# Patient Record
Sex: Male | Born: 1940 | Race: White | Hispanic: No | Marital: Single
Health system: Southern US, Community
[De-identification: ages and names within clinical notes are randomized; demographics above are authoritative.]

---

## 2009-02-07 ENCOUNTER — Ambulatory Visit: Payer: Self-pay | Admitting: Nephrology

## 2010-11-19 ENCOUNTER — Ambulatory Visit: Payer: Self-pay | Admitting: Ophthalmology

## 2011-05-15 ENCOUNTER — Ambulatory Visit: Payer: Self-pay | Admitting: Internal Medicine

## 2011-05-20 ENCOUNTER — Ambulatory Visit: Payer: Self-pay | Admitting: Internal Medicine

## 2011-05-20 LAB — CBC CANCER CENTER
Basophil #: 0 "x10 3/mm "
Basophil %: 0.1 %
Eosinophil #: 0.2 "x10 3/mm "
Eosinophil %: 1.7 %
HCT: 31.6 % — ABNORMAL LOW
HGB: 10.4 g/dL — ABNORMAL LOW
Lymphocyte %: 6.3 %
Lymphs Abs: 0.7 "x10 3/mm " — ABNORMAL LOW
MCH: 26.4 pg
MCHC: 32.7 g/dL
MCV: 81 fL
Monocyte #: 0.7 "x10 3/mm "
Monocyte %: 6.1 %
Neutrophil #: 9.4 "x10 3/mm " — ABNORMAL HIGH
Neutrophil %: 85.8 %
Platelet: 535 "x10 3/mm " — ABNORMAL HIGH
RBC: 3.93 "x10 6/mm " — ABNORMAL LOW
RDW: 18.9 % — ABNORMAL HIGH
WBC: 10.9 "x10 3/mm " — ABNORMAL HIGH

## 2011-05-20 LAB — PROTIME-INR
INR: 1.1
Prothrombin Time: 14.1 s

## 2011-05-20 LAB — COMPREHENSIVE METABOLIC PANEL
Albumin: 2.9 g/dL — ABNORMAL LOW (ref 3.4–5.0)
Anion Gap: 11 (ref 7–16)
BUN: 26 mg/dL — ABNORMAL HIGH (ref 7–18)
Calcium, Total: 10.1 mg/dL (ref 8.5–10.1)
Chloride: 98 mmol/L (ref 98–107)
Creatinine: 1.9 mg/dL — ABNORMAL HIGH (ref 0.60–1.30)
Osmolality: 282 (ref 275–301)
Potassium: 5.2 mmol/L — ABNORMAL HIGH (ref 3.5–5.1)
SGOT(AST): 28 U/L (ref 15–37)
Sodium: 139 mmol/L (ref 136–145)

## 2011-05-20 LAB — APTT: Activated PTT: 33.2 s

## 2011-05-25 ENCOUNTER — Ambulatory Visit: Payer: Self-pay | Admitting: Internal Medicine

## 2011-05-26 LAB — PROTIME-INR: Prothrombin Time: 13.8 secs (ref 11.5–14.7)

## 2011-05-28 ENCOUNTER — Ambulatory Visit: Payer: Self-pay | Admitting: Internal Medicine

## 2011-05-29 ENCOUNTER — Ambulatory Visit: Payer: Self-pay | Admitting: Internal Medicine

## 2011-06-07 ENCOUNTER — Inpatient Hospital Stay: Payer: Self-pay | Admitting: Internal Medicine

## 2011-06-07 LAB — CBC
HCT: 40.8 % (ref 40.0–52.0)
HGB: 13.1 g/dL (ref 13.0–18.0)
MCH: 26.4 pg (ref 26.0–34.0)
MCV: 82 fL (ref 80–100)
RBC: 4.95 10*6/uL (ref 4.40–5.90)
RDW: 19.4 % — ABNORMAL HIGH (ref 11.5–14.5)
WBC: 21.5 10*3/uL — ABNORMAL HIGH (ref 3.8–10.6)

## 2011-06-07 LAB — URINALYSIS, COMPLETE
Bacteria: NONE SEEN
Bilirubin,UR: NEGATIVE
Blood: NEGATIVE
Glucose,UR: NEGATIVE mg/dL (ref 0–75)
Hyaline Cast: 1
Ketone: NEGATIVE
Leukocyte Esterase: NEGATIVE
Ph: 5 (ref 4.5–8.0)
Specific Gravity: 1.017 (ref 1.003–1.030)
Squamous Epithelial: NONE SEEN
WBC UR: 1 /HPF (ref 0–5)

## 2011-06-07 LAB — COMPREHENSIVE METABOLIC PANEL
Albumin: 3.1 g/dL — ABNORMAL LOW (ref 3.4–5.0)
Alkaline Phosphatase: 172 U/L — ABNORMAL HIGH (ref 50–136)
Calcium, Total: 9.1 mg/dL (ref 8.5–10.1)
Co2: 23 mmol/L (ref 21–32)
EGFR (African American): 37 — ABNORMAL LOW
EGFR (Non-African Amer.): 31 — ABNORMAL LOW
Osmolality: 289 (ref 275–301)
SGOT(AST): 36 U/L (ref 15–37)
SGPT (ALT): 76 U/L

## 2011-06-07 LAB — PROTIME-INR
INR: 0.9
Prothrombin Time: 12.9 secs (ref 11.5–14.7)

## 2011-06-07 LAB — DIFFERENTIAL
Basophil #: 0 10*3/uL (ref 0.0–0.1)
Eosinophil #: 0 10*3/uL (ref 0.0–0.7)
Eosinophil %: 0.1 %
Lymphocyte %: 1.1 %
Monocyte #: 1 10*3/uL — ABNORMAL HIGH (ref 0.0–0.7)
Neutrophil #: 20.2 10*3/uL — ABNORMAL HIGH (ref 1.4–6.5)
Neutrophil %: 94 %

## 2011-06-07 LAB — PRO B NATRIURETIC PEPTIDE: B-Type Natriuretic Peptide: 327 pg/mL — ABNORMAL HIGH (ref 0–125)

## 2011-06-08 LAB — CBC WITH DIFFERENTIAL/PLATELET
Basophil #: 0 10*3/uL (ref 0.0–0.1)
Basophil %: 0 %
HGB: 11.8 g/dL — ABNORMAL LOW (ref 13.0–18.0)
Lymphocyte %: 1 %
MCH: 26.6 pg (ref 26.0–34.0)
MCHC: 32.1 g/dL (ref 32.0–36.0)
Monocyte %: 5.6 %
Neutrophil #: 13.3 10*3/uL — ABNORMAL HIGH (ref 1.4–6.5)
Neutrophil %: 93.3 %
Platelet: 264 10*3/uL (ref 150–440)
RBC: 4.45 10*6/uL (ref 4.40–5.90)

## 2011-06-08 LAB — COMPREHENSIVE METABOLIC PANEL
Albumin: 2.4 g/dL — ABNORMAL LOW (ref 3.4–5.0)
Alkaline Phosphatase: 148 U/L — ABNORMAL HIGH (ref 50–136)
Anion Gap: 12 (ref 7–16)
BUN: 51 mg/dL — ABNORMAL HIGH (ref 7–18)
Calcium, Total: 8.3 mg/dL — ABNORMAL LOW (ref 8.5–10.1)
Glucose: 62 mg/dL — ABNORMAL LOW (ref 65–99)
Potassium: 4 mmol/L (ref 3.5–5.1)
SGOT(AST): 71 U/L — ABNORMAL HIGH (ref 15–37)
SGPT (ALT): 62 U/L
Total Protein: 5.7 g/dL — ABNORMAL LOW (ref 6.4–8.2)

## 2011-06-08 LAB — VANCOMYCIN, TROUGH: Vancomycin, Trough: 18 ug/mL (ref 10–20)

## 2011-06-09 LAB — URINE CULTURE

## 2011-06-10 LAB — BASIC METABOLIC PANEL
Anion Gap: 10 (ref 7–16)
BUN: 21 mg/dL — ABNORMAL HIGH (ref 7–18)
Chloride: 107 mmol/L (ref 98–107)
Co2: 24 mmol/L (ref 21–32)
EGFR (African American): >60
Osmolality: 286 (ref 275–301)

## 2011-06-12 LAB — PATHOLOGY REPORT

## 2011-06-13 LAB — CULTURE, BLOOD (SINGLE)

## 2011-06-15 LAB — BASIC METABOLIC PANEL WITH GFR
Anion Gap: 11
BUN: 30 mg/dL — ABNORMAL HIGH
Calcium, Total: 9 mg/dL
Chloride: 103 mmol/L
Co2: 24 mmol/L
Creatinine: 1.64 mg/dL — ABNORMAL HIGH
EGFR (African American): 54 — ABNORMAL LOW
EGFR (Non-African Amer.): 45 — ABNORMAL LOW
Glucose: 98 mg/dL
Osmolality: 282
Potassium: 4.3 mmol/L
Sodium: 138 mmol/L

## 2011-06-15 LAB — CBC CANCER CENTER
Basophil #: 0 "x10 3/mm "
Basophil %: 0 %
Eosinophil #: 0 "x10 3/mm "
Eosinophil %: 0.2 %
HCT: 34.5 % — ABNORMAL LOW
HGB: 11 g/dL — ABNORMAL LOW
Lymphocyte %: 1.2 %
Lymphs Abs: 0.2 "x10 3/mm " — ABNORMAL LOW
MCH: 26.7 pg
MCHC: 31.9 g/dL — ABNORMAL LOW
MCV: 84 fL
Monocyte #: 0.3 "x10 3/mm "
Monocyte %: 2.3 %
Neutrophil #: 13.9 "x10 3/mm " — ABNORMAL HIGH
Neutrophil %: 96.3 %
Platelet: 249 "x10 3/mm "
RBC: 4.13 "x10 6/mm " — ABNORMAL LOW
RDW: 21.1 % — ABNORMAL HIGH
WBC: 14.4 "x10 3/mm " — ABNORMAL HIGH

## 2011-06-22 LAB — CBC CANCER CENTER
Basophil #: 0 x10 3/mm (ref 0.0–0.1)
Eosinophil #: 0 x10 3/mm (ref 0.0–0.7)
Lymphocyte #: 0.1 x10 3/mm — ABNORMAL LOW (ref 1.0–3.6)
Lymphocyte %: 9.9 %
MCHC: 33.3 g/dL (ref 32.0–36.0)
Monocyte #: 0 x10 3/mm (ref 0.0–0.7)
Neutrophil #: 0.8 x10 3/mm — ABNORMAL LOW (ref 1.4–6.5)
Neutrophil %: 86.1 %
RBC: 3.64 10*6/uL — ABNORMAL LOW (ref 4.40–5.90)
RDW: 21.5 % — ABNORMAL HIGH (ref 11.5–14.5)

## 2011-06-26 ENCOUNTER — Ambulatory Visit: Payer: Self-pay | Admitting: Internal Medicine

## 2011-06-29 LAB — CBC CANCER CENTER
Bands: 10 %
Basophil #: 0 x10 3/mm (ref 0.0–0.1)
Eosinophil %: 1.4 %
Eosinophil: 1 %
Lymphocyte %: 3.1 %
Lymphocytes: 3 %
MCH: 27 pg (ref 26.0–34.0)
MCV: 81 fL (ref 80–100)
Metamyelocyte: 2 %
Monocyte #: 0 x10 3/mm (ref 0.0–0.7)
Monocyte %: 0.9 %
Monocytes: 5 %
Neutrophil %: 94.6 %
Platelet: 219 x10 3/mm (ref 150–440)
RBC: 3.55 10*6/uL — ABNORMAL LOW (ref 4.40–5.90)
RDW: 21.6 % — ABNORMAL HIGH (ref 11.5–14.5)
Segmented Neutrophils: 78 %
WBC: 5.3 x10 3/mm (ref 3.8–10.6)

## 2011-07-06 LAB — HEPATIC FUNCTION PANEL A (ARMC)
Alkaline Phosphatase: 257 U/L — ABNORMAL HIGH (ref 50–136)
Bilirubin, Direct: 0.1 mg/dL (ref 0.00–0.20)
SGPT (ALT): 82 U/L — ABNORMAL HIGH
Total Protein: 6.2 g/dL — ABNORMAL LOW (ref 6.4–8.2)

## 2011-07-06 LAB — CBC CANCER CENTER
Basophil #: 0 x10 3/mm (ref 0.0–0.1)
Eosinophil #: 0 x10 3/mm (ref 0.0–0.7)
HCT: 29.4 % — ABNORMAL LOW (ref 40.0–52.0)
HGB: 9.8 g/dL — ABNORMAL LOW (ref 13.0–18.0)
Lymphocyte #: 0.2 x10 3/mm — ABNORMAL LOW (ref 1.0–3.6)
Lymphocyte %: 1.3 %
MCH: 27.1 pg (ref 26.0–34.0)
MCHC: 33.2 g/dL (ref 32.0–36.0)
MCV: 82 fL (ref 80–100)
Neutrophil #: 17.3 x10 3/mm — ABNORMAL HIGH (ref 1.4–6.5)
RDW: 23.9 % — ABNORMAL HIGH (ref 11.5–14.5)

## 2011-07-06 LAB — BASIC METABOLIC PANEL
BUN: 24 mg/dL — ABNORMAL HIGH (ref 7–18)
Calcium, Total: 8.3 mg/dL — ABNORMAL LOW (ref 8.5–10.1)
Chloride: 101 mmol/L (ref 98–107)
Co2: 25 mmol/L (ref 21–32)
EGFR (African American): 56 — ABNORMAL LOW
EGFR (Non-African Amer.): 46 — ABNORMAL LOW
Sodium: 136 mmol/L (ref 136–145)

## 2011-07-09 ENCOUNTER — Inpatient Hospital Stay: Payer: Self-pay | Admitting: Internal Medicine

## 2011-07-09 LAB — CK-MB
CK-MB: 2.3 ng/mL (ref 0.5–3.6)
CK-MB: 2 ng/mL (ref 0.5–3.6)

## 2011-07-09 LAB — COMPREHENSIVE METABOLIC PANEL
Alkaline Phosphatase: 261 U/L — ABNORMAL HIGH (ref 50–136)
Anion Gap: 9 (ref 7–16)
Bilirubin,Total: 0.8 mg/dL (ref 0.2–1.0)
Chloride: 99 mmol/L (ref 98–107)
Co2: 27 mmol/L (ref 21–32)
EGFR (African American): >60
EGFR (Non-African Amer.): >60
Glucose: 117 mg/dL — ABNORMAL HIGH (ref 65–99)
Osmolality: 275 (ref 275–301)
Potassium: 4.4 mmol/L (ref 3.5–5.1)
SGPT (ALT): 61 U/L
Sodium: 135 mmol/L — ABNORMAL LOW (ref 136–145)
Total Protein: 6.6 g/dL (ref 6.4–8.2)

## 2011-07-09 LAB — PROTIME-INR
INR: 0.9
Prothrombin Time: 12.5 secs (ref 11.5–14.7)

## 2011-07-09 LAB — CBC
HCT: 32.4 % — ABNORMAL LOW (ref 40.0–52.0)
MCHC: 32.4 g/dL (ref 32.0–36.0)
MCV: 83 fL (ref 80–100)
Platelet: 442 10*3/uL — ABNORMAL HIGH (ref 150–440)
RBC: 3.91 10*6/uL — ABNORMAL LOW (ref 4.40–5.90)
WBC: 17.4 10*3/uL — ABNORMAL HIGH (ref 3.8–10.6)

## 2011-07-09 LAB — APTT: Activated PTT: 27.2 secs (ref 23.6–35.9)

## 2011-07-09 LAB — TROPONIN I: Troponin-I: <0.02 ng/mL

## 2011-07-09 LAB — DIGOXIN LEVEL: Digoxin: 1.37 ng/mL

## 2011-07-09 LAB — MAGNESIUM: Magnesium: 1.8 mg/dL

## 2011-07-10 LAB — CK-MB: CK-MB: 1.7 ng/mL (ref 0.5–3.6)

## 2011-07-10 LAB — COMPREHENSIVE METABOLIC PANEL
Albumin: 1.9 g/dL — ABNORMAL LOW (ref 3.4–5.0)
Alkaline Phosphatase: 253 U/L — ABNORMAL HIGH (ref 50–136)
Bilirubin,Total: 0.3 mg/dL (ref 0.2–1.0)
Calcium, Total: 7.7 mg/dL — ABNORMAL LOW (ref 8.5–10.1)
Chloride: 101 mmol/L (ref 98–107)
Creatinine: 1.07 mg/dL (ref 0.60–1.30)
Glucose: 165 mg/dL — ABNORMAL HIGH (ref 65–99)
Osmolality: 281 (ref 275–301)
Potassium: 4.5 mmol/L (ref 3.5–5.1)
SGOT(AST): 94 U/L — ABNORMAL HIGH (ref 15–37)
SGPT (ALT): 63 U/L

## 2011-07-10 LAB — CBC WITH DIFFERENTIAL/PLATELET
Basophil #: 0 10*3/uL (ref 0.0–0.1)
Basophil %: 0 %
Eosinophil %: 0 %
HCT: 25.3 % — ABNORMAL LOW (ref 40.0–52.0)
HGB: 8.4 g/dL — ABNORMAL LOW (ref 13.0–18.0)
Lymphocyte #: 0.1 10*3/uL — ABNORMAL LOW (ref 1.0–3.6)
MCH: 27.2 pg (ref 26.0–34.0)
MCV: 82 fL (ref 80–100)
Monocyte #: 0 10*3/uL (ref 0.0–0.7)
Monocyte %: 0.2 %
Neutrophil #: 11.5 10*3/uL — ABNORMAL HIGH (ref 1.4–6.5)
Platelet: 327 10*3/uL (ref 150–440)
RBC: 3.07 10*6/uL — ABNORMAL LOW (ref 4.40–5.90)
WBC: 11.6 10*3/uL — ABNORMAL HIGH (ref 3.8–10.6)

## 2011-07-12 LAB — BASIC METABOLIC PANEL
BUN: 23 mg/dL — ABNORMAL HIGH (ref 7–18)
Calcium, Total: 7.6 mg/dL — ABNORMAL LOW (ref 8.5–10.1)
Co2: 25 mmol/L (ref 21–32)
Creatinine: 1.21 mg/dL (ref 0.60–1.30)
EGFR (African American): >60
EGFR (Non-African Amer.): >60
Glucose: 71 mg/dL (ref 65–99)
Potassium: 4.2 mmol/L (ref 3.5–5.1)
Sodium: 136 mmol/L (ref 136–145)

## 2011-07-12 LAB — CBC WITH DIFFERENTIAL/PLATELET
Basophil #: 0 10*3/uL (ref 0.0–0.1)
Basophil %: 0 %
Eosinophil %: 0.5 %
HCT: 23.2 % — ABNORMAL LOW (ref 40.0–52.0)
HGB: 7.6 g/dL — ABNORMAL LOW (ref 13.0–18.0)
Lymphocyte %: 3.1 %
MCH: 27 pg (ref 26.0–34.0)
MCHC: 32.7 g/dL (ref 32.0–36.0)
Monocyte #: 0 10*3/uL (ref 0.0–0.7)
Monocytes: 1 %
Neutrophil #: 3.3 10*3/uL (ref 1.4–6.5)
RBC: 2.81 10*6/uL — ABNORMAL LOW (ref 4.40–5.90)
RDW: 23.1 % — ABNORMAL HIGH (ref 11.5–14.5)
WBC: 3.4 10*3/uL — ABNORMAL LOW (ref 3.8–10.6)

## 2011-07-12 LAB — IRON AND TIBC
Iron Bind.Cap.(Total): 225 ug/dL — ABNORMAL LOW (ref 250–450)
Iron: 140 ug/dL (ref 65–175)

## 2011-07-12 LAB — LACTATE DEHYDROGENASE: LDH: 555 U/L — ABNORMAL HIGH (ref 87–241)

## 2011-07-12 LAB — RETICULOCYTES: Reticulocyte: 1.2 % (ref 0.5–1.5)

## 2011-07-13 LAB — CBC WITH DIFFERENTIAL/PLATELET
Basophil #: 0 10*3/uL (ref 0.0–0.1)
HCT: 23.1 % — ABNORMAL LOW (ref 40.0–52.0)
Lymphocyte #: 0 10*3/uL — ABNORMAL LOW (ref 1.0–3.6)
Lymphocyte %: 2.7 %
MCH: 27.1 pg (ref 26.0–34.0)
MCHC: 32.7 g/dL (ref 32.0–36.0)
MCV: 83 fL (ref 80–100)
Monocyte #: 0 10*3/uL (ref 0.0–0.7)
Monocyte %: 0.5 %
Neutrophil #: 1.2 10*3/uL — ABNORMAL LOW (ref 1.4–6.5)
RDW: 22.9 % — ABNORMAL HIGH (ref 11.5–14.5)
WBC: 1.2 10*3/uL — CL (ref 3.8–10.6)

## 2011-07-14 LAB — CBC WITH DIFFERENTIAL/PLATELET
Basophil #: 0 10*3/uL (ref 0.0–0.1)
Eosinophil #: 0 10*3/uL (ref 0.0–0.7)
HCT: 29.5 % — ABNORMAL LOW (ref 40.0–52.0)
Lymphocyte %: 1.9 %
MCH: 27.1 pg (ref 26.0–34.0)
MCHC: 33 g/dL (ref 32.0–36.0)
Monocyte #: 0 10*3/uL (ref 0.0–0.7)
Neutrophil #: 1.2 10*3/uL — ABNORMAL LOW (ref 1.4–6.5)
Neutrophil %: 95.1 %
RBC: 3.59 10*6/uL — ABNORMAL LOW (ref 4.40–5.90)
RDW: 19.5 % — ABNORMAL HIGH (ref 11.5–14.5)

## 2011-07-14 LAB — OCCULT BLOOD X 1 CARD TO LAB, STOOL: Occult Blood, Feces: NEGATIVE

## 2011-07-15 LAB — CBC WITH DIFFERENTIAL/PLATELET
Basophil #: 0 10*3/uL (ref 0.0–0.1)
Basophil %: 0 %
Eosinophil #: 0 10*3/uL (ref 0.0–0.7)
Lymphocyte #: 0 10*3/uL — ABNORMAL LOW (ref 1.0–3.6)
Lymphocyte %: 4.8 %
MCH: 27.1 pg (ref 26.0–34.0)
MCHC: 32.9 g/dL (ref 32.0–36.0)
Monocyte #: 0 10*3/uL (ref 0.0–0.7)
Neutrophil #: 0.8 10*3/uL — ABNORMAL LOW (ref 1.4–6.5)
Neutrophil %: 89.8 %
Platelet: 111 10*3/uL — ABNORMAL LOW (ref 150–440)
RDW: 19.9 % — ABNORMAL HIGH (ref 11.5–14.5)

## 2011-07-18 LAB — EXPECTORATED SPUTUM ASSESSMENT W GRAM STAIN, RFLX TO RESP C

## 2011-07-21 ENCOUNTER — Inpatient Hospital Stay: Payer: Self-pay | Admitting: Internal Medicine

## 2011-07-21 LAB — CBC
HCT: 33.6 % — ABNORMAL LOW (ref 40.0–52.0)
HGB: 11.1 g/dL — ABNORMAL LOW (ref 13.0–18.0)
MCH: 27.7 pg (ref 26.0–34.0)
MCHC: 33.2 g/dL (ref 32.0–36.0)
Platelet: 195 10*3/uL (ref 150–440)
RBC: 4.01 10*6/uL — ABNORMAL LOW (ref 4.40–5.90)
WBC: 8.2 10*3/uL (ref 3.8–10.6)

## 2011-07-21 LAB — COMPREHENSIVE METABOLIC PANEL
Albumin: 1.9 g/dL — ABNORMAL LOW (ref 3.4–5.0)
BUN: 16 mg/dL (ref 7–18)
EGFR (Non-African Amer.): 60 — ABNORMAL LOW
Osmolality: 279 (ref 275–301)
Potassium: 4 mmol/L (ref 3.5–5.1)
SGOT(AST): 57 U/L — ABNORMAL HIGH (ref 15–37)
SGPT (ALT): 50 U/L
Total Protein: 5.8 g/dL — ABNORMAL LOW (ref 6.4–8.2)

## 2011-07-21 LAB — PROTIME-INR
INR: 1
Prothrombin Time: 13.4 secs (ref 11.5–14.7)

## 2011-07-21 LAB — CK TOTAL AND CKMB (NOT AT ARMC)
CK, Total: 58 U/L (ref 35–232)
CK-MB: 2.4 ng/mL (ref 0.5–3.6)

## 2011-07-21 LAB — APTT: Activated PTT: 29.2 secs (ref 23.6–35.9)

## 2011-07-22 LAB — CBC WITH DIFFERENTIAL/PLATELET
Basophil #: 0 10*3/uL (ref 0.0–0.1)
Eosinophil #: 0 10*3/uL (ref 0.0–0.7)
Eosinophil %: 0.1 %
HGB: 9.7 g/dL — ABNORMAL LOW (ref 13.0–18.0)
Lymphocyte #: 0.2 10*3/uL — ABNORMAL LOW (ref 1.0–3.6)
MCH: 27.4 pg (ref 26.0–34.0)
Monocyte #: 0.2 10*3/uL (ref 0.0–0.7)
Platelet: 226 10*3/uL (ref 150–440)
WBC: 8.9 10*3/uL (ref 3.8–10.6)

## 2011-07-22 LAB — BASIC METABOLIC PANEL
Anion Gap: 11 (ref 7–16)
BUN: 16 mg/dL (ref 7–18)
Calcium, Total: 7.7 mg/dL — ABNORMAL LOW (ref 8.5–10.1)
Chloride: 101 mmol/L (ref 98–107)
Creatinine: 1.44 mg/dL — ABNORMAL HIGH (ref 0.60–1.30)
EGFR (African American): >60
EGFR (Non-African Amer.): 52 — ABNORMAL LOW
Glucose: 246 mg/dL — ABNORMAL HIGH (ref 65–99)
Osmolality: 279 (ref 275–301)
Potassium: 4.2 mmol/L (ref 3.5–5.1)
Sodium: 135 mmol/L — ABNORMAL LOW (ref 136–145)

## 2011-07-22 LAB — URINALYSIS, COMPLETE
Bacteria: NONE SEEN
Glucose,UR: 150 mg/dL (ref 0–75)
Ketone: NEGATIVE
Protein: 30
Squamous Epithelial: NONE SEEN

## 2011-07-23 LAB — CBC WITH DIFFERENTIAL/PLATELET
Basophil #: 0 10*3/uL (ref 0.0–0.1)
Basophil %: 0 %
Eosinophil %: 0.1 %
HGB: 9.1 g/dL — ABNORMAL LOW (ref 13.0–18.0)
Lymphocyte #: 0.2 10*3/uL — ABNORMAL LOW (ref 1.0–3.6)
Lymphocyte %: 1.6 %
MCHC: 33.3 g/dL (ref 32.0–36.0)
Monocyte #: 0.7 10*3/uL (ref 0.0–0.7)
Monocyte %: 5.2 %
Neutrophil %: 93.1 %
Platelet: 273 10*3/uL (ref 150–440)
RDW: 20.1 % — ABNORMAL HIGH (ref 11.5–14.5)
WBC: 12.9 10*3/uL — ABNORMAL HIGH (ref 3.8–10.6)

## 2011-07-23 LAB — HEMOGLOBIN A1C: Hemoglobin A1C: 7.3 % — ABNORMAL HIGH (ref 4.2–6.3)

## 2011-07-23 LAB — BASIC METABOLIC PANEL
Anion Gap: 12 (ref 7–16)
BUN: 19 mg/dL — ABNORMAL HIGH (ref 7–18)
Calcium, Total: 7.4 mg/dL — ABNORMAL LOW (ref 8.5–10.1)
Creatinine: 1.26 mg/dL (ref 0.60–1.30)
EGFR (African American): >60
EGFR (Non-African Amer.): >60
Glucose: 183 mg/dL — ABNORMAL HIGH (ref 65–99)
Osmolality: 281 (ref 275–301)
Potassium: 4.3 mmol/L (ref 3.5–5.1)

## 2011-07-23 LAB — HEPATIC FUNCTION PANEL A (ARMC)
Bilirubin, Direct: <0.1 mg/dL (ref 0.00–0.20)
SGPT (ALT): 35 U/L

## 2011-07-26 LAB — CULTURE, BLOOD (SINGLE)

## 2011-07-27 ENCOUNTER — Ambulatory Visit: Payer: Self-pay | Admitting: Internal Medicine

## 2011-09-26 DEATH — deceased

## 2013-09-12 IMAGING — CT CT CHEST W/O CM
1 series · 15 of 33 positions shown, 19 images · non-contrast
Comparison: none

REASON FOR EXAM: dyspnea, hypotension, rll opacity
COMMENTS:

[Series 2: soft tissue · axial · 0.72mm/px · z∈[-438,-132]mm · 15 of 73 slices shown, 19 images]
[im 6/73  mediastinal]
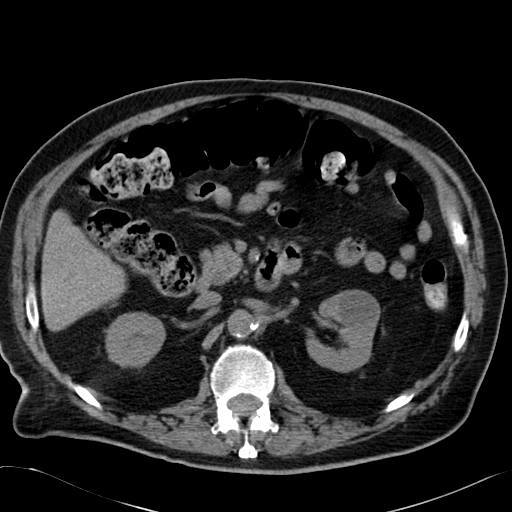
[im 6/73  lung]
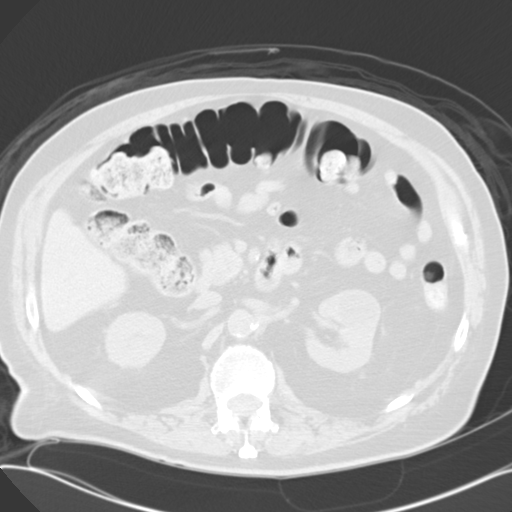
[im 11/73  lung]
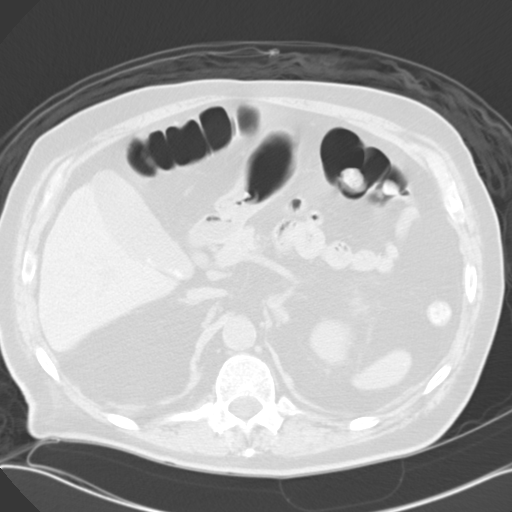
[im 15/73  lung]
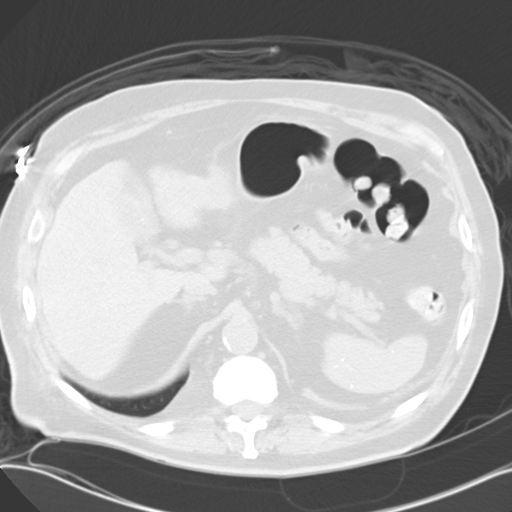
[im 19/73  lung]
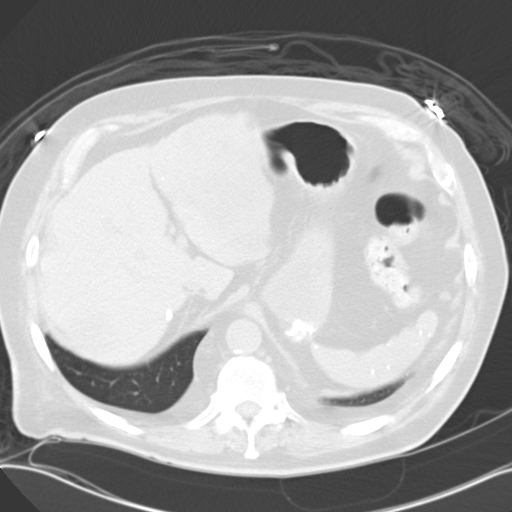
[im 25/73  mediastinal]
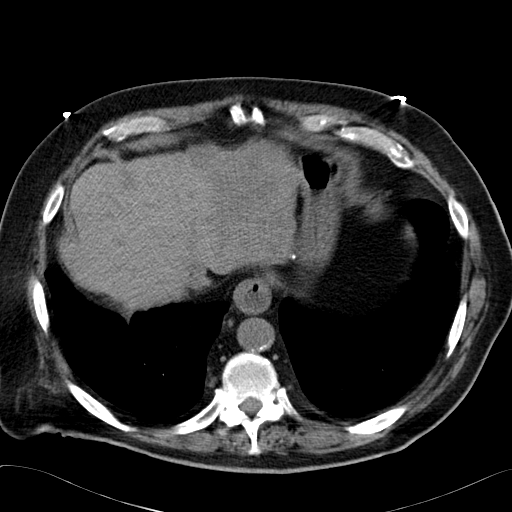
[im 25/73  lung]
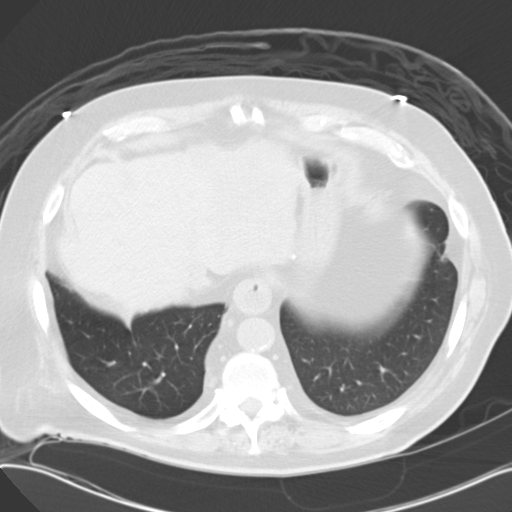
[im 29/73  lung]
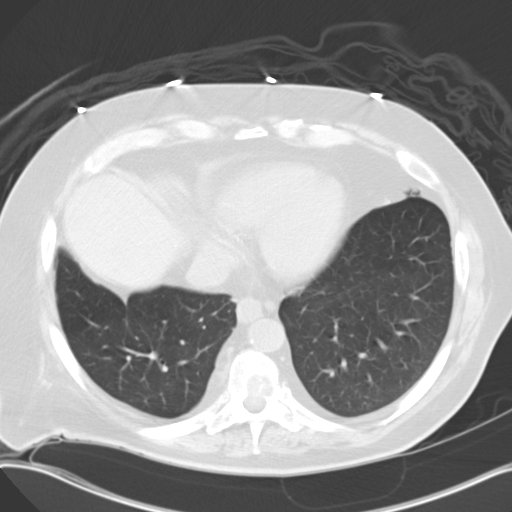
[im 33/73  lung]
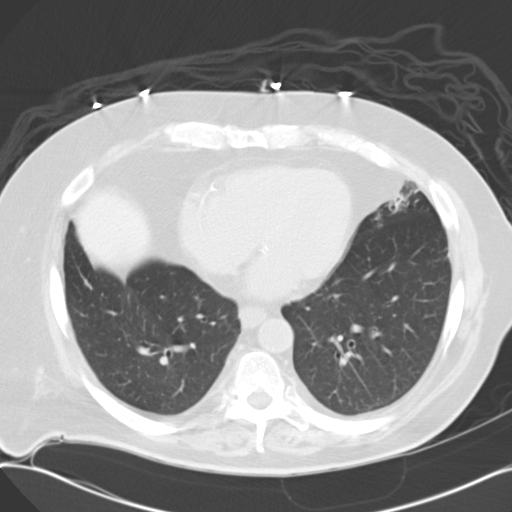
[im 38/73  lung]
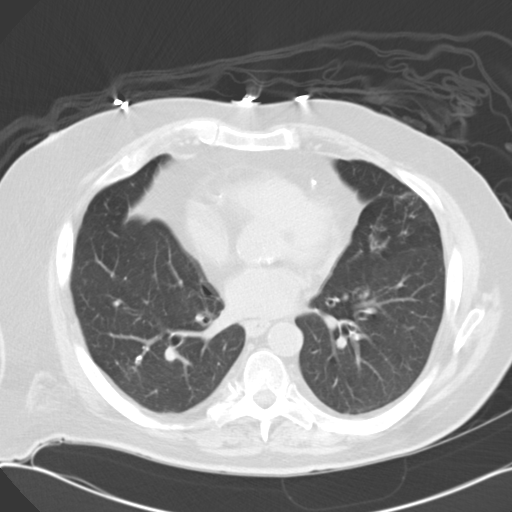
[im 41/73  mediastinal]
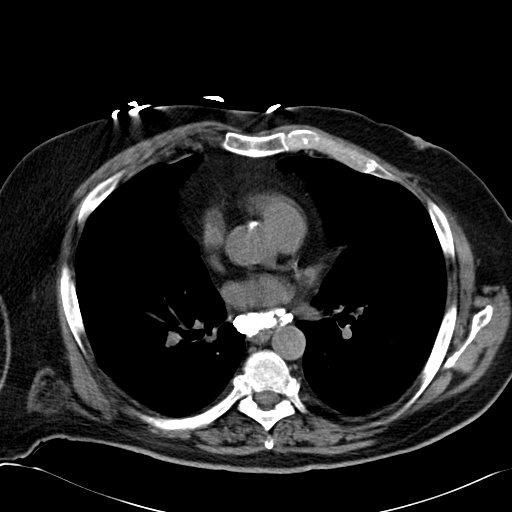
[im 41/73  lung]
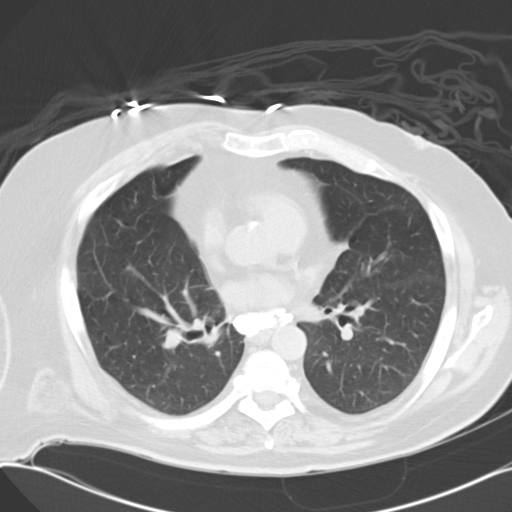
[im 44/73  lung]
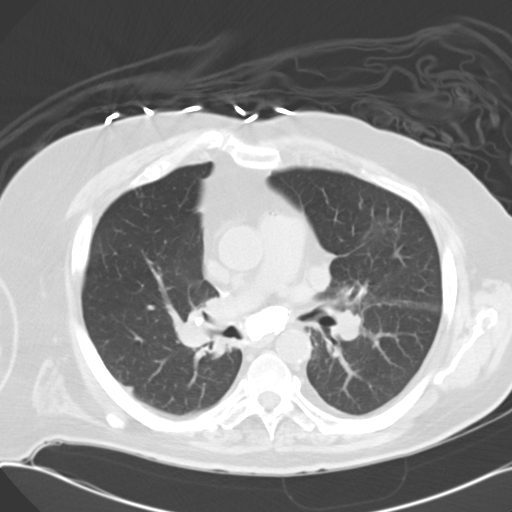
[im 49/73  lung]
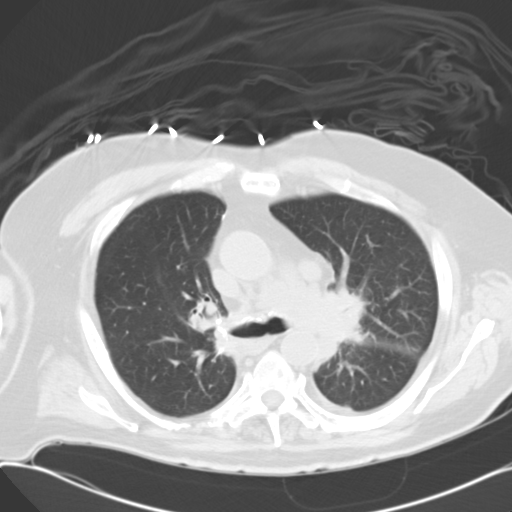
[im 54/73  lung]
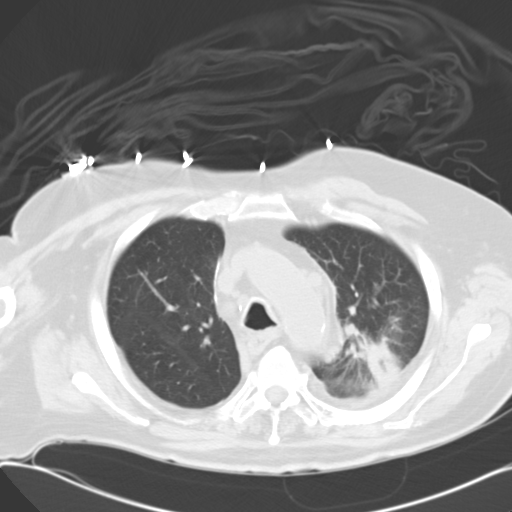
[im 58/73  mediastinal]
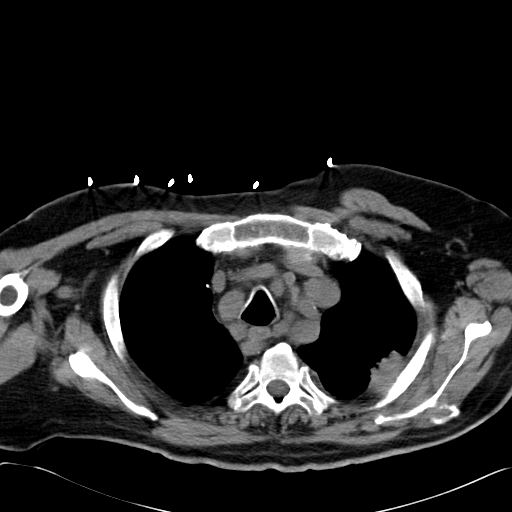
[im 58/73  lung]
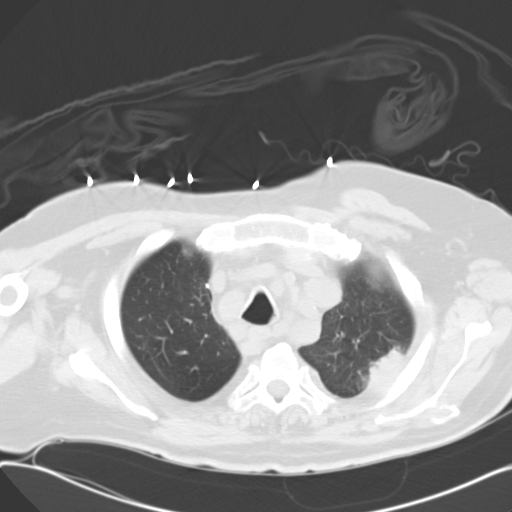
[im 62/73  lung]
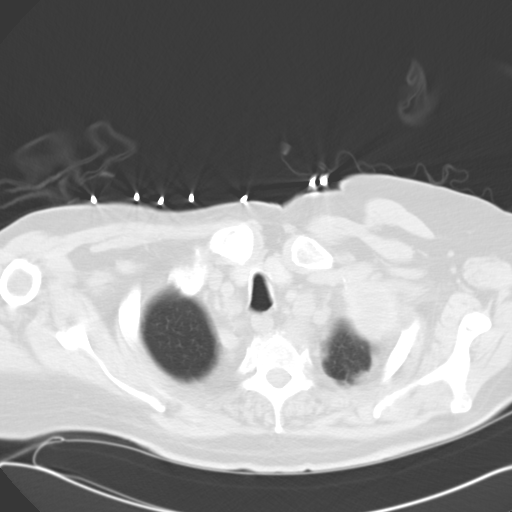
[im 67/73  lung]
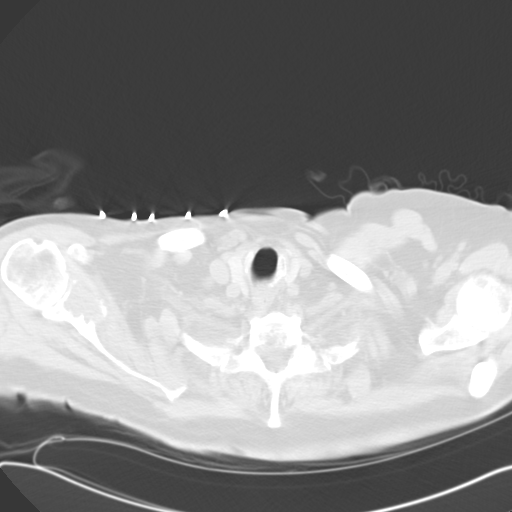

[15 of 33 positions shown; findings below may reference images not displayed]

PROCEDURE:     CT  - CT CHEST WITHOUT CONTRAST  - June 07, 2011  [DATE]

RESULT:     Axial noncontrast CT scanning was performed through the chest
with reconstructions at 5 mm intervals and slice thicknesses. Comparison is
made to the study 15 May, 2011. Review of multiplanar reconstructed
images was performed separately on the VIA monitor.

There is an abnormal pleural-based mass posteriorly in the left upper lobe
with density radiating toward the left hilum. There is a left hilar mass as
well. There may been slight interval decrease in the size of the hilar mass
which measures 5.1 cm AP and 3.8 cm transversely on today's study. The
pleural based mass measures approximately 2.6 cm AP by approximately 3.1 cm
in oblique transverse dimension. There is an enlarged AP window lymph node
which measures 2 cm in long axis. There is a densely calcified nodal mass in
the subcarinal region. There are a few small calcified lymph nodes in the
right hilum. The caliber of the thoracic aorta is normal. The cardiac
chambers are normal in size. There is no pleural effusion.

Within the upper abdomen there are numerous hypodensities within the liver.
The largest lies in the left lobe and measures at least 5.8 cm in diameter.
There are tiny layering gallstones in the dependent portion of the
gallbladder. I see no adrenal masses. There are punctate calcifications in
the normal sized spleen.
IMPRESSION: 1. There has been mild interval decrease in the size of the left hilar mass
and the left upper lobe mass. There remain enlarged hilar lymph nodes.
2. There are emphysematous changes in both lungs. There is a stable lingular
density that suggests fibrosis.
3. There are widespread hepatic metastatic lesions with the largest mass
noted in the left hepatic lobe.
4. There are gallstones present.

## 2014-08-19 NOTE — Discharge Summary (Signed)
PATIENT NAME:  Cameron [name], Cameron [name] MR#:  [id number] DATE OF BIRTH:  Jul 24, 1940  DATE OF ADMISSION:  07/21/2011 DATE OF DISCHARGE:  07/24/2011  PRIMARY CARE PHYSICIAN: Ezequiel Kayser, MD  PRIMARY ONCOLOGIST: Leia Alf, MD  REASON FOR ADMISSION: Shortness of breath.  DISCHARGE DIAGNOSES: 1. Acute hypoxic respiratory failure which has progressed to chronic. The patient has met criteria for continuous home oxygen.  2. Bilateral pneumonia with suspected healthcare-associated pneumonia with recent hospitalization prior to this one for pneumonia.  3. Acute chronic obstructive pulmonary disease exacerbation.  4. Acute renal failure superimposed on chronic kidney disease.  5. 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 The patient is currently on chemotherapy.  6. 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  7. History of chemotherapy induced myelosuppression with history of neutropenia and anemia.  8. Chronic atrial fibrillation. The patient is not on anticoagulation at baseline.  9. History of diabetes mellitus, which is insulin requiring.  10. Oral thrush.  11. History of hypertension.  12. Recently diagnosed right lower pneumonia, 07/15/2011, requiring hospital admission.   CODE STATUS: DO NOT RESUSCITATE.   CONSULTANTS:  1. Leia Alf, MD - Oncology. 2. Hospice screen.  DISCHARGE DISPOSITION: Home with hospice.   DISCHARGE MEDICATIONS:  1. Oxygen at 2 liters per minute via nasal cannula continuously.  2. Doxycycline 100 mg p.o. every 12 hours x5 days. 3. Augmentin 875 mg p.o. twice a day x5 days. 4. Prednisone taper as written on prescription. 5. Albuterol metered dose inhaler 1 to 2 puffs inhaled every 4 to 6 hours p.r.n.  6. Klonopin 0.5 mg take 1/2  to 1 tab p.o. twice a day p.r.n. anxiety.  7. Morphine 20 mg/mL - take 0.5 mL (10 milligrams) orally every 1 to 2 hours p.r.n. pain.  8. Folic acid 1 mg p.o. daily.  9. DuoNebs one dose inhaled every 4 hours p.r.n.  10. Fentanyl 75 mcg/h patch transdermally applied every 72 hours. 11. Promethazine 25 mg p.o. every six hours p.r.n. nausea and vomiting. 12. Docusate sodium 100 mg p.o. every other day.  13. Humalog mix 50/50 12 units subcutaneously twice a day. 14. Robitussin AC 5 to 10 mL p.o. every 6 hours p.r.n. cough. 15. Advair Diskus 250/50 one puff inhaled twice a day. 16. Digoxin 125 mcg p.o. daily.   DISCHARGE CONDITION: Improved, stable.   DISCHARGE ACTIVITY: As tolerated.  DISCHARGE DIET: ADA.   DISCHARGE INSTRUCTIONS:  1. Take medications as prescribed. 2. Return to the emergency department for recurrence of symptoms or for worsening shortness of breath, cough, fever, chills, weakness, or for any bleeding complications.  FOLLOWUP INSTRUCTIONS: 1. Followup with Dr. Leia Alf within 1 to 2 weeks. The patient needs repeat CBC within one week.  2. Followup with Dr. Ezequiel Kayser in 1 to 2 weeks. The patient needs repeat BMP within one week.  PROCEDURES: Chest x-ray, PA and lateral, on 07/21/2011: Patchy increase in densities bilaterally consistent with pneumonia.   PERTINENT LABORATORY DATA: CBC on admission WBC 8.3, hemoglobin 11.1, hematocrit 33.6 and platelets 195.   Blood cultures x2, from 07/21/2011: One bottle with no growth to date. The other bottle with Coag-negative staph, possible contamination from skin flora. Suspected to be a contaminant.   EKG on admission: Sinus tachycardia with heart rate 112 beats per minute with  normal axis and with no acute ST or T wave changes.   Cardiac enzymes negative on admission.   Liver function tests on admission: Total protein 5.8,  albumin 1.19, total bilirubin 0.5, AST 57, ALT 50, and alkaline phosphatase 230.   Hemoglobin  A1c 7.3.   Renal function normal on admission with BUN 16 and creatinine 1.27. Creatinine 1.44 on 07/22/2011 and creatinine improved to 1.26 on 07/23/2011.   BRIEF HISTORY AND HOSPITAL COURSE:  1. The patient is a very pleasant 74 year old male with past medical history of recurrent metastatic adenocarcinoma carcinoma of the lung with metastases to the liver and bone as well as spinal metastases status post right upper lobe resection and currently on chemotherapy and followed by Dr. Leia Alf with history of abnormal LFTs due to liver mets and vocal cord polyps secondary to recurrent laryngeal nerve compression with chronic hoarseness as a result, CKD, hypertension, chronic obstructive pulmonary disease, chronic atrial fibrillation, history of chemotherapy-induced myelosuppression with neutropenia and anemia, and recent hospitalization for pneumonia who presented to the emergency department with complaints of shortness of breath and productive cough and was found to be hypoxic on room air with oxygen saturations 88%. Chest x-ray was suggestive of bilateral pneumonia and thereafter he was admitted to the medical floor for further evaluation and management. Blood cultures were obtained and he was empirically started on IV antibiotics and was maintained on supplemental oxygen via nasal cannula which was titrated to keep his oxygen saturations greater than 90%. He was also noted to have acute chronic obstructive pulmonary disease exacerbation secondary to his pneumonia for which he was started on IV steroids and placed on inhalers and bronchodilator support, and he was also maintained on Advair. With all these measures, the patient's symptoms have significantly improved. He did still remain hypoxic, however, mostly with ambulation, but also somewhat with rest. He could not be successfully weaned off oxygen and met criteria for continuous home oxygen, which has been arranged for this patient. Once his condition  was noted to have improved, he was transitioned off IV antibiotics to oral antibiotics and transitioned off IV steroids to oral prednisone taper, which he will continue and complete as an outpatient. Home oxygen has been arranged for the patient. One out of two sets of blood cultures showed Coag-negative staph and this was felt to be a contaminant and likely skin flora. The other blood culture did not reveal any growth to date. It was felt that he probably has healthcare associated pneumonia as he was recently hospitalized for pneumonia recently in March 2013. Therefore, he was placed on broad-spectrum IV antibiotics which covers for most all organisms including MRSA as well as Pseudomonas, and he was initially on vancomycin and Zosyn. His IV antibiotics have been converted to oral route, and he will be on doxycycline and Augmentin as an outpatient. The patient did not provide a sputum specimen to be sent to the lab for analysis and his cough is now improved and he is having a dry cough. The patient was seen in consultation by Dr. Ma Hillock of oncology who was in agreement with antibiotic therapy for the patient's pneumonia. His chemotherapy is currently on hold given his acute infection and the patient will followup with Dr. Ma Hillock upon hospital discharge to discuss when and if palliative chemotherapy can be continued. The patient is currently receiving Alimta as an outpatient.  2. Acute renal failure superimposed on CKD - Acute component was felt to be prerenal in etiology from volume depletion.  The patient did admit to drinking less fluids prior to admission and after hydration with IV fluids he is now euvolemic and his acute renal failure has resolved. He is encouraged to maintain adequate oral hydration. He was nonoliguric.  3. History of chemotherapy-induced myelosuppression and history of neutropenia and anemia - the patient was not noted to be neutropenic during this hospitalization, although he has  asymptomatic anemia and there were no acute indications for blood transfusion. He was however transfused 2 units of PRBCs the week prior to this hospitalization, during his previous admission and then it seems that his hemoglobin and hematocrit have remained stable post transfusion and Dr. Ma Hillock recommends transfusing the patient for a hemoglobin of less than 8. The patient will follow up closely with Dr. Ma Hillock as an outpatient to have his CBC repeated. The patient was instructed to return to the hospital he notices any fevers, chills, or any bleeding complications.  4. Chronic atrial fibrillation - the patient maintained heart rate control on digoxin and EKG reveals sinus tachycardia, but this was likely secondary to his acute illness with hypoxia and pneumonia and with oxygen and antibiotics his heart rate has normalized. He was monitored with off unit telemetry and had no runs of atrial fibrillation and had predominantly sinus rhythm. The patient is not on any anticoagulation or aspirin at baseline and would be at high risk for bleeding were he to receive these agents given his history of anemia and also anemia requiring recent blood transfusion. The patient will likely receive further chemotherapy as an outpatient which will likely cause further drop of his hemoglobin and hematocrit. The patient was agreeable to not taking any aspirin or anticoagulants at this time. In any case, he remains in normal sinus rhythm and not in atrial fibrillation.  5. Type 2 diabetes mellitus - relatively well controlled with Hemoglobin A1c of 7.3. The patient is to continue Humalog mix at 50/50 as an outpatient. He did receive some sliding scale insulin as an inpatient as he had some episodes of hyperglycemia which was likely steroid induced. Suspect his blood sugars will improve as his steroids are being tapered.  6. Oral thrush likely from immunosuppression, chemotherapy induced, as well as from underlying malignancy plus  recent use of steroids and antibiotics for which the patient received nystatin swish and swallow and thereafter his oral thrush had resolved. 7. Abnormal liver function tests due to liver mets - this will need to be closely followed by Dr. Ma Hillock as an outpatient. The patient was without any abdominal pain, nausea, or vomiting.   The patient was seen by our hospice care team prior to discharge and was felt to be appropriate for home hospice which has been arranged for the patient. The patient also stated to myself as well as to the nursing staff that in the event of cardiopulmonary arrest he did not wish to receive any CPR, cardiac defibrillation, or intubation or mechanical ventilation. Therefore, we updated his code status to DO NOT RESUSCITATE, as per the patient's request.   On 07/24/2011, the patient was hemodynamically stable with significant improvement in his shortness of breath and cough and was without any fevers or chills and felt to be stable for discharge home with hospice with close outpatient follow-up to which the patient was agreeable.  TIME SPENT ON DISCHARGE: Greater than 30 minutes. ____________________________ Romie Jumper, MD knl:slb D: 07/28/2011 22:06:07 ET T: 07/29/2011 13:18:30 ET JOB#: [id number]  cc: Romie Jumper, MD, <Dictator> Christena Flake. [place],  MD Sandeep R. Ma Hillock, MD Romie Jumper MD ELECTRONICALLY SIGNED 08/05/2011 22:22

## 2014-08-19 NOTE — Consult Note (Signed)
Reason for Visit: This 74 year old Male patient presents to the clinic for initial evaluation of  Metastatic cancer .   Referred by Dr. Ma Hillock.  Diagnosis:   Chief Complaint/Diagnosis   74 year old male with widespread metastatic disease most likely of lung primary   Pathology Report Pathology pending biopsy tomorrow of iliac crest    Imaging Report PET CT scan reviewed    Referral Report Clinical notes reviewed    Planned Treatment Regimen Palliative radiation therapy to spine    HPI   patient is a 75 year old male status post surgical resection of the right lung for lung cancer at [facility] in 2007 followed by adjuvant chemotherapy. He has recently developed hoarseness of voice. He is also develop some upper and mid back pain. Recent PET/CT scan was performed showing widespread metastatic disease. Also has destruction of vertebral bodies specially upper thoracic and midthoracic region. He is having no cough hemoptysis or chest tightness at this time. I been asked to evaluate the patient for possible palliative radiation therapy to his spine.  Past Hx:    Kidney Functions:    Diabetic:    Hypertension:    Bone Met:    Lung Cancer:    Right Upper Lobectomy December 2007:   Past, Family and Social History:   Past Medical History positive    Cardiovascular atrial fibrillation; hyperlipidemia; hypertension    Respiratory COPD    Genitourinary Chronic renal insufficiency    Endocrine diabetes mellitus    Family History noncontributory    Social History positive    Social History Comments Greater than 50-pack-year smoking history quit smoking one month prior    Additional Past Medical and Surgical History Accompanied by family member today   Allergies:   No Known Allergies:   Home Meds:  Home Medications:  morphine 20 mg/mL concentrate: Take 0.5 mL (10 mg) orally every 1 - 2 hours as needed for pain, Active  Duragesic-12 12 mcg/hr film, extended release: 1 PATCH  transdermal every 72 hours x 30 days, Active  Megace 40 mg/mL suspension: Take 20 mL (800 mg) orally once a day , Active  dexamethasone 4 mg tablet: Take one tablet by mouth 4 times a day x3 days, then  1 tablet 3 times a day x3 days, then 1 tablet twice a day x3 days, then 1 tablet once daily x3 days and stop , Active  Humalog Mix 75/25 subcutaneous suspension: 25 unit(s) subcutaneous 2 times a day, Active  pravastatin 40 mg oral tablet: 1 tab(s) orally once a day (at bedtime), Active  metoprolol succinate 25 mg oral tablet, extended release: 1 tab(s) orally once a day, Active  furosemide 20 mg oral tablet: 1 tab(s) orally once a day, Active  enalapril 20 mg oral tablet: 1 tab(s) orally once a day, Active  ProAir HFA: 2 puff(s) inhaled 2 times a day and as needed , Active  Spiriva 18 mcg inhalation capsule: 1 each inhaled once a day, Active  amlodipine $RemoveBef'20mg'vCxapeflpA$ : 1 tab(s) orally once a day, Active  Review of Systems:   General negative    Performance Status (ECOG) 1    Skin negative    Breast negative    Ophthalmologic negative    ENMT negative    Respiratory and Thorax see HPI    Cardiovascular see HPI    Gastrointestinal negative    Genitourinary see HPI    Musculoskeletal negative    Neurological negative    Psychiatric negative    Hematology/Lymphatics negative  Endocrine see HPI    Allergic/Immunologic negative   Physical Exam:  General/Skin/HEENT:   General normal    Skin normal    Eyes normal    ENMT normal    Head and Neck normal    Additional PE Well-developed wheelchair-bound male in NAD. He is having pain localized to his upper thoracic mid thoracic spine. Motor sensory and DTR levels are equal and symmetric in the upper and lower extremities. No significant adenopathy is appreciated. Lungs are clear to A&P. No pain is elicited on deep palpation of the spine. No sensory or motor levels is a appreciated.   Breasts/Resp/CV/GI/GU:   Respiratory and  Thorax normal    Cardiovascular normal    Gastrointestinal normal    Genitourinary normal   MS/Neuro/Psych/Lymph:   Musculoskeletal normal    Neurological normal    Lymphatics normal   Other Results:  Radiology Results: CT:    18-Jan-13 09:00, CT Chest Without Contrast   CT Chest Without Contrast    REASON FOR EXAM:    abnormal CXR hx of lung CA RU lobectomy WO because of   diabetes decreased kid...  COMMENTS:       PROCEDURE: MCT - MCT CHEST WITHOUT CONTRAST  - May 15 2011  9:00AM     RESULT:     TECHNIQUE: CT of the chest is performed without contrast with images   reconstructed in the axial plane at 5.0 mm slice thickness.     There is no previous examination for comparison.    FINDINGS: Irregular soft tissue density is present in the posterior   lateral left upper lobe, extending toward the paramediastinal region at     the level of the aortic arch with soft tissue density extending into the   AP window and left hilar region. The appearance is most consistent with   underlying malignant disease. The largest anterior to posterior extent   appears to be on image #17 at mediastinal window settings measuring 5.05   cm with a medial to lateral dimension of 6.79 cm on image #16. The   superior to inferior extent of the process extends over at least 8 cm.   Along the superior margin of the subpleural to pleural based mass, there   is evidence of invasion into the chest wall seen in the area of image   #10. There is no pleural or pericardial effusion. Calcified density in   the subcarinal region is consistent with probable granulomatous change.   Multiple punctate calcifications are seen in the spleen and liver. There   appears to be evidence of soft tissue in the AP window region but given   the lack of IV contrast, accurate measurement and assessment of the soft   tissue there is difficult. There is a low attenuation lesion in the   anterior mid to upper left kidney  measuring 3.13 cm diameter with a     Hounsfield reading of 0 consistent with a cyst. No adrenal mass is   evident. The esophagus appears to be unremarkable. Lung window images   reveal pleural based paraspinal soft tissue density extending from image   #25 to image #30. At mediastinal window settings this area measures 2.17   cm anterior to posterior and 1.04 cm medial to lateral on image #27.   Para-aorticadenopathy is present with the largest area seen near the   great vessels measuring 2.05 x 2.31 cm on image #11. An enlarged lymph   node is seen just  superior and lateral to the main pulmonary artery   measuring at short axis 1.51 cm image #20.    IMPRESSION:  Left upper lobe malignancy with direct extension to the   hilar region with mediastinal adenopathy. There is a paraspinal mass in   the left lower lobe which is nonspecific. There are bronchiectatic   changes in the left lung base in the region of the lingula with some   underlying fibrosis. There is evidence of possible chest wall invasion by     the left upper lobe mass deep to the scapula.      Thank you for this opportunity to contribute to the care of your patient.           Verified By: Sundra Aland, M.D., MD  Nuclear Med:    28-Jan-13 09:46, PET/CT Scan Lung Cancer Restaging   PET/CT Scan Lung Cancer Restaging    REASON FOR EXAM:    lung nodule  COMMENTS:       PROCEDURE: PET - PET/CT RESTAGING LUNG CA  - May 25 2011  9:46AM       RESULT:     History: Lung nodule.    Comparison: CT of 05/15/2011. Following determination of a fasting blood   sugar of 113 mg/deciliter 12.53 mCi of F-18 FDG was administered to the   patient for whole-body PET CT. CT was obtained for attenuation correction   and fusion. Intensely PET positive lesions are noted throughout the left   upper chest wall, posterior left upper lobe, mediastinal lymph nodes,     hilar lymph nodes, left scapula, right scapula, thoracolumbar  spine,   ribs, pelvis, and sacrum. These findings are consistent with extensive   metastatic disease possibly from lung cancer. Intense activity is also   notedin the left and right lobe of the liver consistent with metastatic   disease. Maximum SUV levels up to approximately 7 noted.     IMPRESSION: Diffuse malignancy as described above.           Verified By: Osa Craver, M.D., MD   Assessment and Plan:  Impression:   widespread metastatic disease most likely recurrent lung cancer with spinal involvement in 74 year old male  Plan:   at the stomach to go ahead with palliative radiation therapy to his thoracic spine. We will plan on delivering 3450 cGy in 15 fractions to trydecrease some of the radiation esophagitis. Risks and benefits of treatment including dysphasia skin reaction were reviewed with the patient and he seems to comprehend my treatment plan well. I have set him up and simulated him on an urgent basis today and will start his treatments tomorrow. We will also review his pathology when it becomes available.  I would like to take this opportunity to thank you for allowing me to continue to participate in this patient's care.  CC Referral:  cc:: Dr. Ezequiel Kayser   Electronic Signatures: Baruch Gouty, Roda Shutters (MD)  (Signed 30-Jan-13 11:39)  Authored: HPI, Diagnosis, Past Hx, PFSH, Allergies, Home Meds, ROS, Physical Exam, Other Results, Encounter Assessment and Plan, CC Referring Physician   Last Updated: 30-Jan-13 11:39 by Armstead Peaks (MD)

## 2014-08-19 NOTE — Discharge Summary (Signed)
PATIENT NAME:  Cameron [name], Cameron [name] MR#:  [id number] DATE OF BIRTH:  12-28-1940  DATE OF ADMISSION:  06/07/2011 DATE OF DISCHARGE:  06/11/2011  DISCHARGE DIAGNOSES:  1. Hypotension, weakness possibly due to relative adrenal insufficiency. 2. Anemia. 3. Acute renal failure due to dehydration versus acute tubular necrosis from hypotension. 4. Elevated AST and ALP due to liver metastasis.  5. Metastatic lung cancer. 6. 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 7. Elevated [name]-dimer. 8. History of atrial fibrillation. 9. Chronic obstructive pulmonary disease.  10. Diabetes   DISPOSITION: The patient is being discharged home with [facility]/home health.   DIET: Low sodium, 1,800 calorie ADA diet.   ACTIVITY: As tolerated.   FOLLOW-UP:  1. Follow-up with Dr. Sherrlyn HockPandit 02/18 at 1:15 p.m.  2. Follow-up with primary care physician, Dr. Mickey Farberavid Thies, in 1 to 2 weeks after discharge.   DISCHARGE MEDICATIONS:  1. Decadron 4 mg daily.  2. Digoxin 0.125 mg daily.  3. Tussionex 5 milliliters b.i.[name]. p.r.n. cough.  4. Fentanyl patch 25 mcg q.72h.  5. Humalog 75/25, twenty-five units subcutaneously b.i.[name].  6. NovoLog insulin sliding scale.  7. DuoNebs q.6 hours p.r.n. shortness of breath.  8. Folic acid 1 mg daily.  9. Morphine 20 mg/mL 0.5 mL q.1-2h. p.r.n. for pain. 10. ProAir HFA 2 puffs b.i.[name]. p.r.n.   CONSULTATIONS:  1. Palliative care consultation with Dr. Harvie JuniorPhifer.  2. Oncology consultation with Dr. Sherrlyn HockPandit.   LABORATORY, DIAGNOSTIC, AND RADIOLOGICAL DATA: The patient's cosyntropin showed elevated to normal cortisol level. CT of chest without contrast showed interval decrease in the size of the left hilar mass and left upper lobe mass. There is hilar lymphadenopathy. Severe emphysema in both lungs, stable lingular density. Widespread metastatic hepatic lesions. Blood cultures negative. No growth so far. CBC: White count 21.5 on admission, 14.2 by the time of discharge.  Hemoglobin 11.8. Normal platelet count. BUN 58 on admission, 21 by the time of discharge, creatinine 2.27 on admission, 1.36 by the time of discharge. BNP 327, elevated AST and ALP.  Cardiac enzymes negative.   HOSPITAL COURSE: The patient is a 74 year old male with past medical history of metastatic lung cancer with extensive metastasis, chronic renal insufficiency, hypertension, chronic obstructive pulmonary disease, atrial fibrillation, who has been off anticoagulation, hyperlipidemia who presented with weakness, failure to thrive, falls, poor appetite and hypotension.  1. Hypotension. The patient was initially admitted to the Intensive Care Unit and started on Levophed drip. He was resuscitated with fluids, which resulted in improvement in his blood pressure and he was able to be weaned off the Levophed drip. All his antihypertensive medications were held. The etiology was unclear, but there was a possibility that he had relative adrenal insufficiency. The patient had a recent short taper of Decadron to prevent clot compression because of receiving palliative radiation therapy to his spine. A cosyntropin stimulation test showed normal/elevated levels of cortisol. After discussion with Dr. Sherrlyn HockPandit, the patient's primary oncologist, the patient has been restarted on low-dose Decadron. He was also empirically started on antibiotics thinking of possibility of sepsis. However, all of his cultures have been negative and he has been afebrile. Therefore, his antibiotics were discontinued.  2. Anemia. The patient was felt to be hemoconcentrated on admission. After IV fluids his hemoglobin fell to his baseline of around 10.4.  3. Acute renal failure. The patient had a creatinine of 2.27 on admission which had significantly improved by the time of discharge. This was felt to be due to possible  dehydration versus ATN due to hypotension. His last creatinine was 1.9 in 01/13. At the time of discharge, his creatinine is  1.36.  4. Elevated AST and ALP. This was possibly due to liver mets. It was felt better to hold the patient's aspirin for the time being.  5. Left vocal cord paralysis secondary to recurrent laryngeal nerve compression. The patient was seen by a speech therapist during the hospitalization. No signs of aspiration were noted.  6. Elevated [name]-dimer. This was felt to be due to lung cancer.  7. History of atrial fibrillation. The patient has been in normal sinus rhythm. All his antihypertensive medications have been discontinued due to blood pressure. He was started on digoxin with good heart rate control.  8. Chronic obstructive pulmonary disease. The patient remained stable. There was no evidence of chronic obstructive pulmonary disease exacerbation. He should continue his ProAir HFA and p.r.n. nebulizer treatment.  9. Metastatic lung cancer. The patient was seen by Dr. Sherrlyn Hock in the hospital. He has severe disease. After discussion with his primary oncologist, the patient decided to be a DO NOT RESUSCITATE. Initially, plans were made to discharge the patient to rehab. However, he wanted to get one more session of chemotherapy. Therefore, Dr. Sherrlyn Hock recommended discharge home with home health/[facility] rather than going to rehab. As per Dr. Sherrlyn Hock if the patient does poorly, he will arrange transfer to hospice facility as an outpatient.   TIME SPENT: 45 minutes.  ____________________________ Cameron Meigs, MD sp:ap [name]: 06/11/2011 15:42:17 ET T: 06/12/2011 12:16:08 ET JOB#: [id number]  cc: Cameron Meigs, MD, <Dictator> Cameron Dear. Harrington Challenger, MD Cameron Meigs MD ELECTRONICALLY SIGNED 06/12/2011 16:49

## 2014-08-19 NOTE — H&P (Signed)
PATIENT NAME:  Cameron HolmesSTACY, Cameron [name] MR#:  [id number] DATE OF BIRTH:  1940/05/11  DATE OF ADMISSION:  07/09/2011  PRIMARY CARE PHYSICIANS: Sandeep R. Sherrlyn HockPandit, MD and Neomia Dearavid N. Thies, MD    HISTORY OF PRESENT ILLNESS: The patient is a 74 year old Caucasian male with past medical history significant for history of metastatic lung carcinoma, history of diabetes, chronic renal insufficiency, hypertension, chronic obstructive pulmonary disease, also atrial fibrillation, presented to the hospital with complaints of worsening shortness of breath. According to the patient, he was sitting on the couch and watching TV at around 6:00 a.m. on the day of admission when suddenly he started noticing chest palpitation as well as left-sided left lower chest area tightness, 3 out of 10 by intensity. No lightheadedness or dizziness, no presyncopal episode, however, he been having progressive shortness of breath, gradually worsening over the past couple of weeks. He has been coughing yellow phlegm, however, denies any fevers or chills. Because he was not on oxygen at home, he presented to the hospital; and he was noted to have oxygen saturation of 94% on room air, however, his oxygen saturation dropped down to 86% whenever he exerted himself. Hospitalist Services were contacted for admission.   PAST MEDICAL HISTORY: Significant for: 1. 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  2. History of admission in February 2013 for hypertension, weakness, which was felt to be due to relative adrenal insufficiency, anemia, acute renal failure due to dehydration versus acute  tubular necrosis from hypertension, also elevated AST as well as ALT due to liver metastasis, metastatic lung cancer.  3. Left vocal cord paralysis due to recurrent laryngeal nerve compression.  4. History of atrial fibrillation. 5. Chronic obstructive pulmonary disease. 6. Diabetes, insulin-dependent.   FAMILY HISTORY: The patient's mother had chronic obstructive pulmonary disease. No cancer or early coronary artery disease.   SOCIAL HISTORY: The patient lives alone. He quit smoking about a month ago and drinking alcohol about a month ago.   CODE STATUS:  FULL CODE.  His Power of Gerrit Friendsttorney is his brother, Franco NonesMarion Doug Chowning, who can be reached at [phone] or [phone].   ALLERGIES: No known drug allergies.   MEDICATIONS: According to medical records, he was discharged on: 1. Decadron 4 mg p.o. daily. 2. Digoxin 125 mcg p.o. daily. 3. Fentanyl 25 mcg every 72 hours topically.  4. Humalog, now he is on a different dose as 50-50, 20 units in the morning and 15 units in the p.m. 5. NovoLog sliding-scale.  6. Duo-Nebs every 6 hours.  7. Folic acid 1 mg p.o. daily. 8. Morphine 20 mg/1 mL solution, 10 mg every 1 to 2 hours as needed.  9. ProAir 2 puffs twice daily as needed.   He is continuing: 1. DuoNebs.  2. Cheratussin before meals 10 mL every 4 hours as needed. 3. Digoxin 125 mg p.o. daily.  4. Docusate 100 mg p.o. every other day. 5. Fentanyl 75 mcg every 72 hours.  6. Folic acid 1 mg p.o. daily.  7. As mentioned above, Humalog 50-50, 20 units in the morning and 15 units at bedtime. 8. Morphine 10 mg every 1 to 2 hours  as needed.  9. Promethazine 25 mg every 6 hours as needed.   REVIEW OF SYSTEMS: Positive for fatigue, pains in his chest, some cough, hemoptysis a couple of weeks ago as well as chest discomfort, chest pressure, feeling arrhythmias and palpitations. Also constipation, last bowel movement was on Saturday, which was four days ago.  CONSTITUTIONAL: Otherwise,  denies any fevers, chills, weakness, weight loss or gain. EYES: In regards to eyes, denies any blurry vision, double vision, glaucoma, or cataracts. ENT: Denies any tinnitus, allergies, epistaxis, sinus pain, dentures, difficulty swallowing. RESPIRATORY: Denies any wheezes, asthma, chronic obstructive pulmonary disease. CARDIOVASCULAR: Denies orthopnea, edema, syncope. GASTROINTESTINAL: Denies nausea, vomiting, diarrhea, rectal bleeding, change of bowel habits. GENITOURINARY: Denies dysuria, hematuria, frequency, or incontinence. ENDOCRINOLOGY: Denies polydipsia, nocturia, thyroid problems, heat or cold intolerance or thirst. HEMATOLOGIC: Denies anemia, easy bruising, bleeding, or swollen glands. SKIN: Denies any acne, rashes, lesions, or change in moles. MUSCULOSKELETAL: Denies arthritis, cramps, swelling, or gout. NEUROLOGIC: Denies numbness, epilepsy, or tremor. PSYCHIATRIC: Denies anxiety, insomnia, or depression.    PHYSICAL EXAMINATION:  VITAL SIGNS: On arrival to the hospital, temperature was 97.9, pulse 134, respirations 24, blood pressure 135/67, saturation 94% on room air.   GENERAL: This is a well well-developed, well-nourished Caucasian male in no significant distress lying on the stretcher. His pupils are equal and reactive to light. Extraocular muscles are intact  No icterus or conjunctivitis. Has normal hearing. No pharyngeal erythema. Mucosa is moist. Mucosa is moist. Some whitish coating on soft palate.  NECK: Supple, nontender.    LUNGS: Diminished breath sounds as well as rales and rhonchi were heard as well as wheezing, inspiratory and expiratory. The patient does have labored inspirations, especially whenever he tries to talk as well as increased effort to breathe, and in mild respiratory distress. His voice is muffled.  I can barely hear it.    CARDIOVASCULAR: S1, S2 appreciated. No murmurs, gallops or rubs. Rhythm was regular.  PMI not lateralized. Chest is nontender to palpation.    EXTREMITIES: 1+ pedal pulses. No lower extremity edema, calf tenderness, or cyanosis noted.   ABDOMEN: Soft, tender in the epigastric area. Mild tenderness in the right upper quadrant also was noted, mild hepatomegaly but no splenomegaly was noted. No masses.   RECTAL: Deferred.   MUSCULOSKELETAL: Muscle strength: Able to move all extremities. No cyanosis, degenerative joint disease, or kyphosis. Gait is not tested.   SKIN: Skin did not reveal any rashes, lesions, erythema, nodularity, or induration. It was warm and dry to palpation. No adenopathy in the cervical region.   NEUROLOGICAL: Cranial nerves grossly intact . Sensory is intact. No dysarthria or aphasia. The patient is alert and oriented to time, person, and place, cooperative. Memory is good.   PSYCHIATRIC: No significant confusion, agitation, or depression noted.   LABORATORY, DIAGNOSTIC AND RADIOLOGICAL DATA:  BMP showed glucose 117, BUN of 23, creatinine 1.17, sodium 135, otherwise unremarkable BMP. The patient's magnesium level is normal at 1.8, albumin level of 2.4, alkaline phosphatase 261, AST elevated to 87.  Troponin less than 0.02.  White blood cell count 17.4, hemoglobin 10.5, platelets 442.  EKG: Sinus tachycardia with premature atrial complexes at 129 beats per minute and normal axis, no acute ST-T changes except, according to EKG criteria, possible inferior ischemia due to T depressions in anterior leads with concern, however,  that this is not sinus tachycardia but atrial flutter with variable conduction with rate 129 beats per minute.  Chest, portable single view on 07/09/2011 showed apparent  paratracheal adenopathy on the right, less prominent than on prior exam in February 2013. Increased density about right hilar area is consistent with the patient's known mass. The area appears less prominent than on exam in February 13 compatible with interval improvement or possibly improvement in pneumonia associated with the  patient's known mass. No new pulmonary infiltrates were identified.  CT scan of chest with contrast 07/09/2011 revealed no pulmonary embolus. Left upper lobe as well as left lower lobe pulmonary arteries were narrowed by a left hilar mass. Mild ground-glass opacities in lower lobes were nonspecific and may be infectious or inflammatory versus pulmonary edema or atelectasis. Correlate clinically. Trace right pleural effusion, new from prior. Mediastinal lymph nodes are decreased in size from prior. Other findings of the patient's known malignancy and widespread metastatic disease are similar to prior as described above. There is severe compression deformity of approximately T3 vertebral body with possible epidural extension of disease that appears similar to prior. Further evaluation could be provided with MRI as indicated.   ASSESSMENT AND PLAN:  1. Acute respiratory failure: Admit the patient to the Medical floor and continue the patient on oxygen. I am concerned that the patient's acute respiratory failure could be mild pneumonia. I will be initiating the patient on antibiotic therapy.  2. Suspected pneumonia with yellow sputum for the past few weeks: As above, we will continue antibiotics. We will get sputum cultures. We will continue oxygen therapy as needed. The patient will likely need oxygen at home.  3. Palpitations: They are likely due to atrial flutter as well as hypoxia. I will get digoxin level checked and will continue digoxin to control the patient's heart rate. Meanwhile, the patient's heart rate is around 129. We may initiate beta blockers or calcium channel blocker if his heart rate remains sustained elevated. We will also check cardiac enzymes x3 as well as TSH.  4. Chest pain: Check cardiac enzymes x3.  5. Epigastric abdominal pain: Unclear etiology, possible gastritis as the patient was taking Decadron; however, I am concerned also about Candida infection, monilial gastritis. So, I will  start him on PPI as well as nystatin orally.  6. Monilial pharyngitis: I will start the patient on Nystatin orally.   TIME SPENT:   One hour.   ____________________________ Katharina Caper, MD rv:cbb [name]: 07/09/2011 13:59:27 ET T: 07/09/2011 16:12:40 ET JOB#: [id number]  cc: Katharina Caper, MD, <Dictator> Sandeep R. Sherrlyn Hock, MD Neomia Dear. Harrington Challenger, MD Katharina Caper MD ELECTRONICALLY SIGNED 07/18/2011 10:12

## 2014-08-19 NOTE — Consult Note (Signed)
ONCOLOGY followup note - overall feels better, eating better. Back pain under control.still weak, ambulating short distance with walker. No fevers.resting in bed, otherwise A, O x 3, NAD.           vitals - afebrile, stable           lungs - decreased BS at bases.           abd - soft, NTCr [id number] year old gentleman with history of multiple medical problems including stage IV widely metastatic non-small-cell lung cancer, preliminary right iliac bone biopsy done last week is showing adenocarcinoma (the patient does also have known adenocarcinoma of the right lung  [facility]). Currently his general condition is stil weak but hypotension has improved and he is beginning to feel better. Have d/w patient and sister present regarding further options including pursuing hospice versus considering palliative chemotherapy next week if he continues to improve. Patient wants to try and pursue chemotherapy. Plan therefore is to initiate home health, will see him back on 2/18 at cancer center and pursue single agent Alimta if he is doing better.    Electronic Signatures: Izola PricePandit, Audy Dauphine Raj (MD)  (Signed on 14-Feb-13 00:05)  Authored  Last Updated: 14-Feb-13 00:05 by Izola PricePandit, Selby Foisy Raj (MD)

## 2014-08-19 NOTE — Consult Note (Signed)
ONCOLOGY followup - still weak but overall feels better.  Less dyspneic, continues on nasal cannula oxygen.  no fever. No bleeding symptomsweak, on nasal cannula oxygen, alert and oriented           vitals - afebrile, stable, 94% on 1L           lungs - bilateral diminished breath sounds, few rhonchi           abdomen - soft, nontender.- hemoglobin 10.8, platelets 111, WBC 900, ANC 800.  1. Stage IV metastatic adenocarcinoma of the lung on palliative chemotherapy with single agent Alimta, received second dose on 03/11. Admitted with acute respiratory failure likely secondary to chronic obstructive pulmonary disease exacerbation. CT scan of the chest is negative for PE, also indicates some improvement in the left hilar tumor and mediastinal lymph nodes, the patient explained that this is indicative of some response to ongoing chemotherapy with Alimta.  Blood counts are dropping likely secondary to myelosuppressive effect of chemotherapy.  ANC is slightly low at 800, recommend continuing on Levaquin/Augmentin for one more week upon discharge.  Platelet count slightly low at 111, no bleeding symptoms.  Will continue to monitor as outpatient. Pain is under good control. Anemia likely from myelosuppression - hemoglobin better at 10.8.  Will continue to monitor as outpatient.advised to keep previously scheduled appointments at cancer Center the same.  He is agreeable to this plan.  Electronic Signatures: Izola PricePandit, Lillien Petronio Raj (MD)  (Signed on 20-Mar-13 16:11)  Authored  Last Updated: 20-Mar-13 16:11 by Izola PricePandit, Cullan Launer Raj (MD)

## 2014-08-19 NOTE — H&P (Signed)
PATIENT NAME:  Cameron [name], Cameron [name] MR#:  [id number] DATE OF BIRTH:  06/04/1940  DATE OF ADMISSION:  07/21/2011  PRIMARY CARE PHYSICIAN: Neomia Dear. Harrington Challenger, MD    ONCOLOGIST: Maren Reamer. Sherrlyn Hock, MD   CHIEF COMPLAINT: Shortness of breath.   HISTORY OF PRESENT ILLNESS: The patient is a 74 year old man who was recently discharged from the hospital, treated at that time with acute respiratory failure, pneumonia. He has metastatic adenocarcinoma of the lung. He states that he has been short of breath now for the past few days, very, very weak and tired, needed help to even walk with a walker. He cannot get up and walk by himself just a short distance. He gets very short of breath. It is even hard for him to talk. He has been having lots of chest congestion. He has been coughing a little bit, yellow phlegm with wheeze. In the Emergency Room, he was found to be hypoxic with a pulse oximetry of 88% on room air as per the ER physician. Hospitalist Services were contacted for further evaluation.   PAST MEDICAL HISTORY:  1. 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 2. 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  3. Atrial fibrillation. 4. Chronic obstructive pulmonary disease. 5. Diabetes.   PAST SURGICAL HISTORY: Five years ago, he had a right upper lobe resection.   ALLERGIES: No known drug allergies.   MEDICATIONS:  1. Roxanol 10 mg every 1 to 2 hours as needed for pain.  2. Folic acid 1 mg daily.  3. DuoNeb every 4 hours as needed.  4. Fentanyl patch 75 mcg patch every 72 hours. 5. Promethazine 25 mg every 6 hours as needed.  6. Colace 100 mg p.o. daily.  7. Levaquin 500 mg p.o. daily.  8. Humalog 50-50 insulin subcutaneous injection 12 units b.i.d. 9. Robitussin AC p.r.n.  10. Advair Diskus 250/50, 1 inhalation daily.  11. Digoxin 125 mcg daily.   FAMILY HISTORY: Mother died at 65 of chronic obstructive pulmonary disease, also had  heart disease. Father died at 66 of a motor vehicle accident.   SOCIAL HISTORY: His brother is now living with him since he was discharged home. He quit smoking in December, alcohol in December. He used to work as a Radiographer, therapeutic.   REVIEW OF SYSTEMS: CONSTITUTIONAL: No fever, no chills, no sweats. Positive for weight loss. Positive for weakness. EYES: He does wear glasses. EARS, NOSE, MOUTH, AND THROAT: Positive for dysphagia to solids. No sore throat. Positive for hoarse voice. CARDIOVASCULAR: Positive for palpitations. RESPIRATORY: Positive for shortness of breath, yellow phlegm, wheezing. No hemoptysis. GASTROINTESTINAL: He did have some loose bowel movements the other day. No bright red blood per rectum, no melena. No abdominal pain. No nausea. No vomiting. GENITOURINARY: No burning on urination. MUSCULOSKELETAL: Positive for right shoulder pain. INTEGUMENT: No rashes or eruptions. NEUROLOGIC: No fainting or blackouts. PSYCHIATRIC: No anxiety or depression. ENDOCRINE: No thyroid problems. HEMATOLOGIC/LYMPHATIC: History of low counts with the chemotherapy. He received a couple of units of blood last admission.   PHYSICAL EXAMINATION:  VITAL SIGNS: On presentation, temperature was 97.7, pulse 123, respirations 30, blood pressure 111/64, pulse oximetry 90%.  ER physician also documented 88%.   GENERAL: Slight respiratory distress.   HEENT: Eyes: Conjunctivae and lids normal. Pupils are equal, round, and reactive to light. Extraocular muscles are intact. No nystagmus. Ears, nose, mouth, and throat: Tympanic membranes no erythema. Nasal mucosa no erythema. Throat positive. Whitish exudate  on tongue and roof of mouth.   NECK: No JVD. No bruits. No lymphadenopathy. No thyromegaly. No thyroid nodules palpated.   RESPIRATORY: Decreased breath sounds bilaterally. Positive use of accessory muscles to breathe. Positive wheeze throughout entire lung field.   CARDIOVASCULAR: S1 and S2 tachycardic. No  gallops, rubs, or murmurs heard. Carotid upstroke 2+ bilaterally. No bruits. Dorsalis pedis pulses 2+ bilaterally. Trace edema of the lower extremity.   ABDOMEN: Soft, nontender. No organomegaly/splenomegaly. Normoactive bowel sounds. No masses felt.   LYMPHATIC: No lymph nodes in the neck.   MUSCULOSKELETAL: Trace edema. No cyanosis on oxygen.   SKIN: Chronic lower extremity discoloration. No other lesions seen.   NEUROLOGICAL: Cranial nerves II through XII are grossly intact. Deep tendon reflexes are 2+ bilateral lower extremity. Power 5 out of 5 upper and lower extremities.   PSYCHIATRIC: The patient is oriented to person, place, and time.   LABORATORY, DIAGNOSTIC AND RADIOLOGICAL DATA:  Patchy increase in density bilaterally consistent with pneumonia. Aspiration could be a consideration.  Glucose 102, BUN 16, creatinine 1.27, sodium 139, potassium 4.0, chloride 103, CO2 23, calcium 8.1, alkaline phosphatase 230, ALT 50, AST 57, albumin 1.9.  White blood cell count 8.2, hemoglobin and hematocrit 11.1 and 33.6, platelet count 195.  INR 1.0. Troponin negative.  ABG shows a pH of 7.45, pCO2 of 34, pO2 of 50, bicarbonate 23.6, and oxygen saturation 88.7-and that is on room air.  EKG showed sinus tachycardia 110 beats per minute, flattening of T waves laterally.   ASSESSMENT AND PLAN:  1. Respiratory failure, using accessory muscles to breathe, tachypnea and hypoxic: Most likely this is secondary to chronic obstructive pulmonary disease exacerbation and pneumonia. I will give oxygen supplement. Must check oxygen level after exercise prior to discharge to see if he is a candidate for home oxygen.  2. Chronic obstructive pulmonary disease exacerbation and pneumonia: The patient was just recently here. Must consider aspiration with his dysphagia. The patient was recently on Rocephin and Zithromax and sent home on Levaquin. We will change antibiotics to Zosyn, which would cover aspiration, and  Zyvox, which would cover MRSA.  We will get a swallow evaluation and give Solu-Medrol 125 mg IV x1, 60 mg IV every 6 hours, and DuoNeb nebulizer solution. Continue Advair.  3. Metastatic lung cancer with chronic pain and vocal cord paralysis: The case was discussed with Dr. Sherrlyn HockPandit, to follow along.  4. Diabetes: I will go sliding scale and Humalog at this point. Sugars will be high on Solu-Medrol.  5. Atrial fibrillation: Continue digoxin.  6. Thrush in the mouth: I will give nystatin swish and swallow.  7. Malnutrition with an INR of 1.9: Most likely secondary to cancer.   CODE STATUS:  The patient is a FULL CODE.     TIME SPENT ON ADMISSION: 55 minutes.    ____________________________ Herschell Dimesichard J. Renae GlossWieting, MD rjw:cbb D: 07/21/2011 13:59:14 ET T: 07/21/2011 15:24:35 ET JOB#: [id number]  cc: Herschell Dimesichard J. Renae GlossWieting, MD, <Dictator> Neomia Dearavid N. Harrington Challengerhies, MD Sandeep R. Sherrlyn HockPandit, MD Salley ScarletICHARD J Rosali Augello MD ELECTRONICALLY SIGNED 07/22/2011 21:00

## 2014-08-19 NOTE — Consult Note (Signed)
PATIENT NAME:  Cameron HolmesSTACY, Kellar D MR#:  [id number] DATE OF BIRTH:  Jan 06, 1941  DATE OF CONSULTATION:  07/10/2011  REFERRING PHYSICIAN: Katharina Caperima Vaickute, MD    CONSULTING PHYSICIAN:  Lourene Hoston R. Sherrlyn HockPandit, MD  REASON FOR CONSULTATION: Progressive shortness of breath, improving stage IV lung cancer, hypoxia.   HISTORY OF PRESENT ILLNESS: The patient is a 74 year old gentleman who has been admitted to the hospital on 07/09/2011 with complaints of progressive shortness of breath, low oxygen saturation when he exerted down to 86%, left-sided lower chest tightness. The patient has known history of chronic obstructive pulmonary disease, atrial fibrillation, diabetes insulin-dependent, left vocal cord paralysis due to laryngeal nerve compression, stage IV recurrent metastatic adenocarcinoma of the lung diagnosed by right iliac bone biopsy January 2013. The patient has been having generalized weakness and dyspnea on exertion issues for the past few months and also recently hospitalized. He has been started on palliative chemotherapy with Alimta, received cycle #2 on 03/11. He states that he tolerated this well. Currently, is beginning to feel better on oxygen and supportive treatment. Denies any major pain issues. CT scan of the chest is negative for PE, also indicates some improvement in the left hilar mass and mediastinal adenopathy.   PAST MEDICAL/SURGICAL HISTORY:  As in history of present illness above. In addition, Status post [facility] followed by 3 to 4 cycles of adjuvant chemotherapy.   FAMILY HISTORY: Noncontributory.   SOCIAL HISTORY: Ex-smoker, quit a few months ago. Denies regular alcohol intake.   ALLERGIES: No known allergies.   MEDICATIONS: 1. Cheratussin AC 10 every four hours p.r.n. cough.  2. Phenergan 25 mg every six hours p.r.n. nausea.  3. Morphine 5 to 10 mg every 1 to 2 hours p.r.n. for pain.  4. Folic acid 1 mg daily.  5. Humulin insulin 50/50  subcutaneous 20 units every a.m., 15 units every p.m.  6. Decadron 4 mg daily.  7. Digoxin 125 mcg daily.  8. Fentanyl 25 mcg patch every 72 hours.  9. DuoNeb inhalers q.6 hours as needed.   REVIEW OF SYSTEMS: CONSTITUTIONAL: Weak and shortness of breath on exertion. Denies fevers or chills. No night sweats. HEENT: Denies any headaches, epistaxis, ear or jaw pain. No new sinus symptoms. CARDIAC: As in history of present illness. No palpitation, orthopnea, or paroxysmal nocturnal dyspnea. LUNGS: As in history of present illness. No hemoptysis or major chest pain on the right side. GASTROINTESTINAL: No nausea, vomiting, or diarrhea. No blood in stools or melena. GENITOURINARY: No dysuria or hematuria. SKIN: No new rashes or pruritus. HEMATOLOGIC: No bleeding symptoms. MUSCULOSKELETAL: Has chronic right shoulder pain, back pain is under good control with current opioid regimen. NEUROLOGIC: No new focal weakness, seizures, or loss of consciousness. ENDOCRINE: No polyuria or polydipsia.   PHYSICAL EXAMINATION:  GENERAL: The patient is weak and tired looking, on nasal cannula oxygen, sitting in bed, otherwise alert and oriented and converses appropriately. No icterus. Mild pallor.   VITAL SIGNS: Temperature 97.8, pulse 92, respirations 18, blood pressure 122/72, sats 96% on 2 liters.   HEENT: Normocephalic, atraumatic. Extraocular movements intact. Sclera anicteric.   NECK: Negative for lymphadenopathy.   CARDIOVASCULAR: S1, S2, regular rate and rhythm.   LUNGS: Lungs show decreased breath sounds in the right upper lobe area and left lower lobe area, mostly unchanged. Occasional rhonchi. No crepitations.   ABDOMEN: Soft, nontender. No hepatomegaly, bowel sounds present.   EXTREMITIES: No major edema or cyanosis.   SKIN:  No generalized rashes.   NEUROLOGIC: Nonfocal, cranial nerves are intact.   LABORATORY, DIAGNOSTIC, AND RADIOLOGICAL DATA: WBC 11,600, hemoglobin 8.4, platelets 327, ANC  11,500, creatinine 1.07, calcium 7.7, potassium 4.5. Liver functions unremarkable except albumin low at 1.9, AST 94 and alkaline phosphatase of 253. Blood culture negative so far.   IMPRESSION AND RECOMMENDATION: 74 year old gentleman with recurrent stage IV metastatic adenocarcinoma of the lung recently started on palliative chemotherapy with single agent Alimta, received second dose on 03/11. Admitted with acute respiratory failure likely secondary to chronic obstructive pulmonary disease exacerbation. CT scan of the chest is negative for PE, also indicates some improvement in the left hilar tumor and mediastinal lymph nodes, the patient explained that this is indicative of some response to ongoing chemotherapy with Alimta. Agree with ongoing supportive care including oxygen, bronchodilators, antibiotics (on Levaquin). Continue current pain medications as it is under good control. CBC does not show neutropenia or thrombocytopenia, hemoglobin is low at 8.4. Continue to monitor blood counts. If hemoglobin drops to less than 8, would recommend packed red blood cell transfusion. Otherwise, no new recommendations at this time. Oncology will continue to follow as needed during hospitalization. The patient, otherwise, advised to keep previously scheduled appointments at [facility] the same. He is agreeable to this plan.   Thank you for the referral. Please feel free to contact me if additional questions.   ____________________________ Maren Reamer Sherrlyn Hock, MD srp:ap D: 07/10/2011 17:45:55 ET T: 07/11/2011 07:57:48 ET JOB#: [id number]  cc: Darryll Capers R. Sherrlyn Hock, MD, <Dictator> Wille Celeste MD ELECTRONICALLY SIGNED 07/13/2011 16:11

## 2014-08-19 NOTE — Discharge Summary (Signed)
PATIENT NAME:  Cameron [name], KASER MR#:  [id number] DATE OF BIRTH:  [birth year]  DATE OF ADMISSION:  07/09/2011 DATE OF DISCHARGE:  07/15/2011  ADMITTING PHYSICIAN: Katharina Caper, MD   DISCHARGING PHYSICIAN: Enid Baas, MD   PRIMARY CARE PHYSICIAN: Neomia Dear. Harrington Challenger, MD   PRIMARY ONCOLOGIST: Maren Reamer. Sherrlyn Hock, MD   HOSPITAL CONSULTATIONS: Oncology consultation by Dr. Janese Banks.   DISCHARGE DIAGNOSES:  1. Acute respiratory distress.  2. Right lower lobe pneumonia.  3. Recurrent metastatic adenocarcinoma of lung, currently on ALIMTA chemotherapy.  4. Acute on chronic anemia requiring 2 units of packed red blood cells transfusion this admission.  5. Neutropenia secondary to chemotherapy.  6. Diabetes mellitus.  7. Chronic atrial fibrillation.  8. Chronic obstructive pulmonary disease.  9. Vocal cord palsy secondary to recurrent laryngeal nerve compression resulting in hoarseness of voice and soft voice.  10. Elevated liver tests secondary to liver metastasis.   DISCHARGE MEDICATIONS:  1. Roxanol 20 mg/mL concentrate-take 10 mg every 1 to 2 hours as needed for pain.  2. Folic acid 1 mg p.o. daily.  3. DuoNebs with albuterol and ipratropium 2.5 mg/0.5 mg in 3 mL inhalation solution via nebs every 4 hours as needed.  4. Fentanyl 75 mcg patch transdermal every 72 hours.  5. Promethazine 25 mg p.o. every 6 hours p.r.n.  6. Colace 100 mg p.o. daily.  7. Levaquin 500 mg daily for 7 days.  8. Humalog 50-50 insulin subcutaneous 12 units b.i.d.  9. Robitussin-AC 5 to 10 mL p.o. every 6 hours p.r.n. for cough.  10. Advair 250/50, 1 puff b.i.d.  11. Digoxin 125 mcg p.o. daily.   HOME OXYGEN: None.   DISCHARGE DIET: Low sodium, ADA diet.   DISCHARGE ACTIVITY: As tolerated.   FOLLOW-UP INSTRUCTIONS:    1. Follow up with Dr. Sherrlyn Hock in one week. Appointment is scheduled for March 27th at 10:30 a.m.  2. The patient was advised to follow up with PCP in two weeks.  3. The patient was  advised to call Dr. Darden Dates office or come to the ER if he has a fever greater than 101.0.  LABORATORY, DIAGNOSTIC AND RADIOLOGICAL DATA:  WBC 0.9, hemoglobin 10.8, hematocrit 32.9, platelet count 111, neutrophils are 8800.  He did receive packed RBC transfusion on 07/13/2011 for a hemoglobin of 7.6.  Serum iron 140, iron binding capacity 225, iron saturation 62%. LDH 55. Ferritin elevated at 2003. Reticulocyte count is 1.2. Haptoglobin is elevated at 549 and erythropoietin is high at 539. Sputum cultures were negative.  Sodium 136, potassium 4.2, chloride 104, bicarbonate 25, BUN 23, creatinine 1.2, glucose 71, calcium 7.6.  Again, blood cultures are negative.  CT chest with contrast showing no pulmonary embolus. Left upper lobe and left lower lobe pulmonary arteries are narrowed by left hilar mass. Mild ground glass opacity in the lower lobes, possibly infectious inflammatory process or atelectasis. Right pleural effusion. Mediastinal lymph nodes are decreased in size when compared to prior. Severe compression deformity of T3 vertebral body, possible epidural extension of disease which appears similar to prior.  White count was elevated at 17.4 on admission.   BRIEF HOSPITAL COURSE: Mr. Misty Stanley is 74 year old unfortunate Caucasian male with past medical history significant for recurrent metastatic adenocarcinoma of the lung with metastasis to the liver and bone and also spine, chronic kidney disease, hypertension, chronic obstructive pulmonary disease, atrial fibrillation, presented to the hospital complaining of worsening dyspnea and also chest pain. He was noted to be hypoxic and was admitted to  the Hospitalist Service.   1. Acute respiratory distress secondary to chronic obstructive pulmonary disease and also mild pneumonia in the left base: It is complicated by his left hilar mass. He has chronic dyspnea at baseline, but his oxygen saturations have been well. He was given supplemental oxygen via  nasal cannula in the hospital, but his saturations have been 94% at rest on room air, and on ambulation they have been between 92% to 94%, so he does not qualify for home oxygen. He was on Rocephin and azithromycin while in the hospital and is being discharged on Levaquin. He was nonneutropenic to begin with when he came in, so he does not qualify under neutropenic fever; but because of recent chemotherapy his white count started to drop, so Levaquin is being given for another seven days and he will follow up with Dr. Sherrlyn HockPandit.  2. Recurrent metastatic lung cancer: Currently followed by Dr. Sherrlyn HockPandit and on chemotherapy. Second cycle of chemotherapy was received on 07/06/2011. Further management per Oncology.  3. 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 He does have neutropenia, and per Dr. Sherrlyn HockPandit that is expected, and he does not need any Neulasta or Neupogen and will follow up in the office in one week. The patient was advised to call Dr. Darden DatesPandit's office if any fevers or come to the ER for febrile neutropenia.  4. Diabetes mellitus: He was on Humalog 50-50 insulin. Because of poor p.o. intake, he had a couple of episodes of hypoglycemia, so his insulin has been adjusted to 12 units subcutaneous b.i.d.  5. Chronic atrial fibrillation: Not on anticoagulation, rate  well controlled. He is on digoxin.   His course has been otherwise uneventful in the hospital. As mentioned above, the patient does not qualify for home oxygen at this time.   DISCHARGE CONDITION: Stable.   DISCHARGE DISPOSITION: Home.   CODE STATUS:  FULL CODE.     TIME SPENT ON DISCHARGE: 45 minutes.  ____________________________ Enid Baasadhika Theoden Mauch, MD rk:cbb D: 07/15/2011 15:55:01 ET T: 07/16/2011 10:04:29 ET JOB#: [id number] cc: Neomia Dearavid N. Harrington Challengerhies, MD Sandeep R. Sherrlyn HockPandit, MD Enid BaasADHIKA Jameria Bradway  MD ELECTRONICALLY SIGNED 07/19/2011 0:07

## 2014-08-19 NOTE — Consult Note (Signed)
PATIENT NAME:  Cameron [name], Cameron [name] MR#:  [id number] DATE OF BIRTH:  1940-10-20  DATE OF CONSULTATION:  06/08/2011  REFERRING PHYSICIAN:  Dr. Dava Najjar  CONSULTING PHYSICIAN:  Dominick Zertuche R. Sherrlyn Hock, MD  REASON FOR CONSULTATION: Metastatic lung cancer.   HISTORY OF PRESENT ILLNESS: The patient is a 74 year old gentleman with history of recurrent metastatic stage IV lung cancer. 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 More recently he has been diagnosed with left lung masses and widespread metastatic disease with preliminary right iliac bone biopsy done last week consistent with adenocarcinoma.) The patient has been admitted to the hospital with progressive shortness of breath and weakness. Also states that his oral intake has declined and his general condition is declining quickly. He also has had recent back pain with vertebral metastasis and has just started on palliative radiation. He was planned for Alimta and carboplatin chemotherapy but has been unable to start on this treatment. Currently states that he does not have any pain issues. He is very weak. He also was found to be hypotensive with blood pressure as low as 50/30 and is currently on Levophed infusion. He is on IV antibiotics.   PAST MEDICAL HISTORY/PAST SURGICAL HISTORY:  1. Recurrent stage IV lung cancer as described above.  2. Chronic renal insufficiency. 3. Hypertension. 4. Diabetes.  5. Chronic obstructive pulmonary disease.  6. Atrial fibrillation, off anticoagulation times one month.  7. Hyperlipidemia.   FAMILY HISTORY: Noncontributory.   SOCIAL HISTORY: Quit smoking one month ago, quit drinking alcohol one month ago.   ALLERGIES: No known drug allergies.     HOME MEDICATIONS:  1. Amlodipine 20 mg daily.  2. Decadron taper.  3. ProAir p.r.n.  4. Pravastatin 40 mg daily.  5. Roxanol 10 mg 1 to 2 hours  p.r.n. for pain.  6. Metoprolol tartrate 25 mg b.i.d.  7. Hydrocodone 2 tablets every six hours p.r.n. for pain.  8. Lasix 20 mg daily. 9. Folate 1 mg daily.  10. Enalapril 20 mg daily.  11. Fentanyl patch 12 mcg every 72 hours.   REVIEW OF SYSTEMS: CONSTITUTIONAL: As in history of present illness. Currently no fevers or chills. HEENT: Dizzy on trying to get up and ambulate. Denies headaches, epistaxis, ear or jaw pain. CARDIAC: No angina, palpitation, orthopnea, or paroxysmal nocturnal dyspnea.  LUNGS: Has dyspnea on exertion and also at rest. Chronic cough with scanty sputum. No hemoptysis. GASTROINTESTINAL: No nausea, vomiting, or diarrhea. No blood in stools. GENITOURINARY: No dysuria or hematuria. Urine output is less. MUSCULOSKELETAL: Back pain is under control. HEMATOLOGIC: No bleeding symptoms. SKIN: No new rashes or pruritus. NEUROLOGIC: Denies any new focal weakness, seizures, or loss of consciousness.  ENDOCRINE: No polyuria or polydipsia.   PHYSICAL EXAMINATION:  GENERAL: The patient is very weak and resting in bed, otherwise alert and oriented to self, place, person, and converses appropriately. On nasal cannula oxygen, no acute distress at rest. No icterus. Mild pallor.   VITAL SIGNS: Temperature 97.9, pulse 106, respirations 14, blood pressure 109/47 on Levophed, 98% on 2 liters oxygen.   HEENT: Normocephalic, atraumatic. Extraocular movements intact. Sclerae anicteric.   NECK: Supple without lymphadenopathy.   CARDIOVASCULAR: S1, S2, regular rate and rhythm.   LUNGS: Lungs show bilateral diminished breath sounds overall. No crepitations or rhonchi.   ABDOMEN: Soft, nondistended, nontender, bowel sounds present.   EXTREMITIES: No major edema or cyanosis.   SKIN:  No generalized rashes or major bruising.   NEUROLOGIC: Cranial nerves are intact, moves all extremities spontaneously.   LABORATORY, DIAGNOSTIC, AND RADIOLOGICAL DATA: WBC 14,200, hemoglobin 11.8, platelet 264,  ANC 13,300, creatinine 2.11, potassium 4, calcium 8.3. Liver functions unremarkable except alkaline phosphatase 148, AST 71, albumin low at 2.4. CT of the chest without contrast on 02/10 continues to show widespread hepatic metastatic lesions, emphysematous changes in both lungs, left hilar and left upper lobe lung masses, and enlarged hilar lymph nodes.   IMPRESSION AND RECOMMENDATIONS: This is a 74 year old gentleman with history of multiple medical problems as described above and more recently diagnosed with stage IV widely metastatic non-small-cell lung cancer. Preliminary right iliac bone biopsy done last week is showing adenocarcinoma. (The patient does also have known adenocarcinoma of the right lung  treated in [facility].) Currently his general condition is very poor. He has been admitted with hypotension. No obvious fevers and blood cultures are negative. The patient is on empiric antibiotic treatments. Agree with ongoing supportive care. I have explained to the patient in detail that given his declining condition and widespread metastatic malignancy, overall prognosis is extremely poor. At this time, we will hold radiation; also, he is not a candidate for chemotherapy. I have also discussed CODE STATUS. The patient does not want mechanical ventilation or resuscitation in case of cardiopulmonary failure/arrest. Will order DO NOT RESUSCITATE status accordingly. No acute pain issues at this time. Continue to monitor for pain or other symptoms. If his general condition does not improve, may need to consider pursuing Hospice and even placement at Hospice home if the patient is unable to manage at home. Will continue to follow.   Thank you for the referral. Please feel free to contact me if additional questions.   ____________________________ Maren ReamerSandeep R. Sherrlyn HockPandit, MD srp:bjt D:  06/09/2011 00:07:11 ET         T: 06/09/2011 06:20:21 ET         JOB#: [id number]   Darryll CapersSANDEEP R Verl Kitson  MD ELECTRONICALLY SIGNED 06/09/2011 9:50

## 2014-08-19 NOTE — H&P (Signed)
PATIENT NAME:  Cameron [name], Cameron [name] MR#:  [id number] DATE OF BIRTH:  [birth year]  DATE OF ADMISSION:  06/07/2011  CHIEF COMPLAINT: Shortness of breath.   HISTORY OF PRESENT ILLNESS: Mr. Cameron [name] is a 74 year old gentleman with recurrent lung cancer who presents with weakness, fatigue, shortness of breath, and decreased p.o. intake secondary to dysphagia over the past two weeks.  Mr. Cameron [name] has a history of stage IIB lung adenocarcinoma on the right, which was resected in 2007, and received 3 to 4 cycles of chemotherapy. In January he presented with recurrent lung CA in his left lung with metastasis to the liver, spine, and shoulders. He has been receiving palliative radiation and there was a plan for him to start Alimta and carboplatin on Monday morning. He finally came to the Emergency Department this morning after falling twice. He was found to be hypotensive with a blood pressure of 50/30, which improved to 80/40 with IV fluids. He received a dose of vancomycin and IV Zosyn.   PAST MEDICAL HISTORY:  1. Diabetes.  2. Chronic renal insufficiency.  3. Hypertension.  4. Chronic obstructive pulmonary disease.  5. Atrial fibrillation, off anticoagulation times one month.  6. Hyperlipidemia.   PRIMARY CARE PHYSICIAN:  Dr. Harrington Challengerhies  FAMILY HISTORY: Significant for his mother with chronic obstructive pulmonary disease, otherwise no cancer or early coronary disease.   SOCIAL HISTORY: He lives alone. He quit smoking about one month ago and quit drinking alcohol about one month ago. He is a FULL CODE. His power of attorney is his brother Cameron [name], who can be reached at [phone] or [phone].   ALLERGIES: He has no known drug allergies.    HOME MEDICATIONS:  1. Amlodipine 20 mg p.o. daily.  2. Dexamethasone taper, 4 mg today and 4 mg tomorrow, then off. 3. ProAir p.r.n.  4. Pravastatin 40 mg.  5. Morphine 20 mg/mL,  10 mg q.1-2 hours p.r.n. pain.  6. Metoprolol tartrate 25 mg p.o. b.i.[name].   7. Hydrocodone 2 tablets p.o. q. 6 h. p.r.n. pain. 8. Furosemide 20 mg p.o. daily.  9. Folate 1 mg p.o. daily.  10. Enalapril 20 mg p.o. daily.  11. Duragesic fentanyl patch 12 mcg, he currently has a patch on his left anterior ribcage which was placed on 02/09 at around 16:00.   PHYSICAL EXAMINATION:  VITAL SIGNS: On admission, temperature 96.9, respiratory rate 20, heart rate is 75, blood pressure 116/62, oxygen saturation 100% on 2 liters nasal cannula. He is not on oxygen at home.   GENERAL: He is well-developed, well-nourished, and in no acute distress.   HEENT: Conjunctivae and lids are without erythema or pallor. Pupils are equally round and reactive to light and accommodation. External inspection of the ears and nose without lesions or masses. Hearing is intact to whispered voice. Lips, teeth, and gums are without lesions or masses.  Oropharynx is clear. Oral mucosa is moist. Posterior pharynx is without exudate.   NECK: Supple. There are no masses. Thyroid is not enlarged. There are no nodules.   LUNGS: He does appear to be in respiratory distress. He is tachypneic and has intercostal retractions and is using accessory muscles. Lungs are clear on the right and left upper lobes, decreased breath sounds left lower lobe.   HEART: Regular but distant. No murmurs, rubs, or gallops.   ABDOMEN: Soft, nontender, nondistended. Liver is enlarged to two fingerbreadths below the inferior costal margin. Spleen is not enlarged. He has normoactive bowel sounds.   LYMPH:  Neck and axilla are free of lymphadenopathy.   EXTREMITIES: He has no clubbing, cyanosis or edema. He has decreased strength, but normal range of motion and tone in all four extremities.   SKIN: Skin and subcutaneous tissue are without rashes or lesions. Palpation of skin reveals no induration or nodules.   NEUROLOGIC: Cranial nerves II-XII are grossly intact. Deep tendon reflexes are 2+ and equal bilaterally. Judgment and  insight are intact. The patient is oriented to person, place, and time.   LABORATORY, DIAGNOSTIC, AND RADIOLOGICAL DATA: White blood cell count is 21.5, hematocrit 40.8, platelets 337, sodium 137, potassium 4.3, chloride 100, bicarbonate 23, BUN is 58, creatinine 2.27. Baseline creatinine checked in January was 1.9. Glucose 86. INR is 0.9. CK 35, MB is less than 0.05. Pro BNP is 327. Lactate is 1.9. Urinalysis is negative.   Chest CT shows a mild interval decrease in the size of the left hilar mass and left upper lobe mass.  There remains an enlarged hilar lymph node. 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 There is a stable lingular density that suggests fibrosis. There are widespread hepatic  metastatic lesions with the largest mass noted in the left hepatic lobe. There are gallstones present.   ASSESSMENT AND PLAN: 74 year old man with lung cancer who presents with sepsis versus adrenal crisis. 1. Hypotension:  Treat empirically with vancomycin and Zosyn. I did not order vancomycin given his acute renal failure. He did get one dose of 1000 mg of vancomycin in the Emergency Department and a vancomycin trough is ordered for the morning. May want to re-dose for level of 15-20. He is ordered for IV fluids. A.m. cosyntropin stim test. Continue dexamethasone taper as planned.  2. Acute renal failure: Hydrate and follow. Good urine output. Last known baseline creatinine was 1.9 in January.  3. Hypertension: Given his hypotension, holding all home blood pressure medications. We will need to restart those slowly as his blood pressure permits.  4. Lung cancer: He does decline Hospice at this time and wants to remain a FULL CODE. We will need to cancel his morning chemotherapy infusion. Morphine for pain control. 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     TOTAL TIME SPENT ON CARE AND COORDINATION: 65 minutes.    ____________________________ Lise Auer Manson Passey, MD jlb:bjt [name]: 06/07/2011 18:54:38 ET T: 06/08/2011 07:05:05 ET JOB#: [id number]  cc: Victorino Dike L. Manson Passey, MD, <Dictator> Neomia Dear. Harrington Challenger, MD Eber Hong MD ELECTRONICALLY SIGNED 06/09/2011 18:23

## 2014-08-19 NOTE — Consult Note (Signed)
REFERRING PHYSICIAN: Katharina Caperima Vaickute, MD   PHYSICIAN:  Etosha Wetherell R. Sherrlyn HockPandit, MD  REASON FOR CONSULTATION: stage IV lung cancer, admitted with progressive SOB.  OF PRESENT ILLNESS: The patient is a 74 year old gentleman who has been admitted to the hospital on 07/21/11 with progressive shortness of breath, low oxygen saturation when he ambulated. He was recently admitted for similar complaints. CXR shows bilateral infiltrates c/w pneumonia. 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 He is on palliative chemotherapy with Alimta, states that he tolerated this well. Currently, is beginning to feel better on oxygen, antibiotic and supportive treatment. Denies any major pain issues. Most recent CT scan of the chest had indicated some improvement in the left hilar mass and mediastinal adenopathy.  MEDICAL/SURGICAL HISTORY:  As in history of present illness above. In addition, Status post [facility] followed by 3 to 4 cycles of adjuvant chemotherapy.  HISTORY: Noncontributory.  HISTORY: Ex-smoker. Denies regular alcohol intake.  No known allergies.   1. Cheratussin AC 10 every four hours p.r.n. cough.  2. Phenergan 25 mg every six hours p.r.n. nausea.  Morphine 5 to 10 mg every 1 to 2 hours p.r.n. for pain.  Folic acid 1 mg daily.  Humulin insulin 50/50 subcutaneous 20 units every a.m., 15 units every p.m.  Decadron 4 mg daily.  Digoxin 125 mcg daily.  Fentanyl 25 mcg patch every 72 hours.  DuoNeb inhalers q.6 hours as needed.  OF SYSTEMS: CONSTITUTIONAL: Weak and shortness of breath on exertion. Denies fevers or chills. No night sweats. HEENT: Denies any headaches, epistaxis, ear or jaw pain. No new sinus symptoms. CARDIAC: As in history of  present illness. No palpitation, orthopnea, or paroxysmal nocturnal dyspnea. LUNGS: As in history of present illness. No hemoptysis or major chest pain on the right side. GASTROINTESTINAL: No nausea, vomiting, or diarrhea. No blood in stools or melena. GENITOURINARY: No dysuria or hematuria. SKIN: No new rashes or pruritus. HEMATOLOGIC: No bleeding symptoms. MUSCULOSKELETAL: Has chronic right shoulder pain, back pain is under good control with current opioid regimen. NEUROLOGIC: No new focal weakness, seizures, or loss of consciousness. ENDOCRINE: No polyuria or polydipsia.  EXAMINATION: The patient is weak and tired looking, on nasal cannula oxygen, sitting in bed, otherwise alert and oriented and converses appropriately. No icterus. SIGNS: Temperature 96.7, pulse 100, respirations 18, blood pressure 118/72, sats 92% on 2 liters. Normocephalic, atraumatic. Extraocular movements intact. Sclera anicteric. Negative for lymphadenopathy. S1, S2, regular rate and rhythm. Lungs show decreased breath sounds in the right upper lobe area and left lower lobe area, mostly unchanged. Occasional rhonchi. Soft, nontender. No major edema No generalized rashes. Nonfocal, cranial nerves are intact.  DIAGNOSTIC, AND RADIOLOGICAL DATA: WBC 8900, hemoglobin 9.6, platelets 226, creatinine 1.44.  AND RECOMMENDATION: 74 year old gentleman with recurrent stage IV metastatic adenocarcinoma of the lung recently started on palliative chemotherapy with single agent Alimta, received second dose on 03/11. Admitted with COPD exacerbation, respiratory failure and pneumonia. CXR shows b/l infiltrates s/o pneumonia. Agree with ongoing supportive care including oxygen, bronchodilators, antibiotics with IV Zosyn and Zyvox. No pain issues, continue current pain medications. Continue to monitor blood counts. If hemoglobin drops to less than 8, would recommend packed red blood cell transfusion. Otherwise, no new recommendations at this time. Oncology  will continue to follow as  needed during hospitalization. Patient is agreeable to this plan. you for the referral. Please feel free to contact me if additional questions.  - will look into see if he can go on Home hospice for end-stage COPD since he wishes to continue on palliative chemo for stage IV lung Ca with Alimta. If general condition declines then he will not be a candidate for chemotherapy also.    Electronic Signatures: Izola Price (MD) (Signed on 27-Mar-13 22:42)  Authored   Last Updated: 28-Mar-13 11:37 by Izola Price (MD)

## 2014-08-19 NOTE — Consult Note (Signed)
ONCOLOGY followup note - still weak, on nasal cannula oxygen.  Dyspneic on ambulation/movement.no fever.  Denies bleeding symptomsweak, on nasal cannula oxygen, otherwise alert and oriented           vitals - 97.7, 82, 18, 158/80, 98% on 2.5 L           lungs - bilateral diminished breath sounds, bilateral few rhonchi           abdomen - soft, nontender.- hemoglobin 7.6, platelets 185, WBC 1200, ANC 1200. 1. Stage IV metastatic adenocarcinoma of the lung on palliative chemotherapy with single agent Alimta, received second dose on 03/11. Admitted with acute respiratory failure likely secondary to chronic obstructive pulmonary disease exacerbation. CT scan of the chest is negative for PE, also indicates some improvement in the left hilar tumor and mediastinal lymph nodes, the patient explained that this is indicative of some response to ongoing chemotherapy with Alimta. Agree with ongoing supportive care including oxygen, bronchodilators, antibiotics (on Levaquin). Continue current pain medications as it is under good control. Anemia likely from myelosuppression - hemoglobin less than 8, given severe dyspnea and fatigue will transfuse 2 units packed red blood cells, patient explained about the benefits and risks of transfusion and he is agreeable.  No obvious bleeding symptoms clinically.  Continue to monitor and transfuse as indicated.Mild leukopenia/neutropenia - no intervention needed at this time.continue to follow and make recommendations. Patient is agreeable to this plan.   Electronic Signatures: Izola PricePandit, Daishon Chui Raj (MD)  (Signed on 18-Mar-13 18:13)  Authored  Last Updated: 18-Mar-13 18:13 by Izola PricePandit, Aniyah Nobis Raj (MD)
